# Patient Record
Sex: Female | Born: 2000
Health system: Southern US, Community
[De-identification: ages and names within clinical notes are randomized; demographics above are authoritative.]

## PROBLEM LIST (undated history)

## (undated) DIAGNOSIS — F419 Anxiety disorder, unspecified: Secondary | ICD-10-CM

## (undated) DIAGNOSIS — E611 Iron deficiency: Secondary | ICD-10-CM

## (undated) HISTORY — DX: Iron deficiency: E61.1

## (undated) HISTORY — DX: Anxiety disorder, unspecified: F41.9

---
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
## 2020-01-29 ENCOUNTER — Ambulatory Visit (INDEPENDENT_AMBULATORY_CARE_PROVIDER_SITE_OTHER): Payer: 59 | Admitting: Pediatrics

## 2020-01-29 ENCOUNTER — Encounter: Payer: Self-pay | Admitting: Pediatrics

## 2020-01-29 ENCOUNTER — Other Ambulatory Visit: Payer: Self-pay

## 2020-01-29 ENCOUNTER — Telehealth: Payer: Self-pay | Admitting: Pediatrics

## 2020-01-29 VITALS — BP 141/91 | HR 121 | Ht 65.35 in | Wt 156.2 lb

## 2020-01-29 DIAGNOSIS — D508 Other iron deficiency anemias: Secondary | ICD-10-CM

## 2020-01-29 NOTE — Telephone Encounter (Signed)
I assume she means Ferritin. Offer her an appointment.

## 2020-01-29 NOTE — Progress Notes (Signed)
   Patient was accompanied by mom amy, who is the primary historian.

## 2020-01-29 NOTE — Progress Notes (Signed)
  Subjective:     Patient ID: Anna Mcknight, female   DOB: [birth year], 19 y.o.   MRN: [id number]  Patient went to donate blood and was declined due to a ferritin level of 8. Previous levels were 23 in Nov 2020 and 21 in Oct 2019.  Patient denies excessive menses, reporting a 3-4 day duration of moderate blood loss. She denies any abdominal pain, black tar-like stools or BRB per rectum.   She reports that she is a picky eater and eats very few vegetables. She does eat some red meat.     Review of Systems  Constitutional: Negative for activity change.  Respiratory: Negative for shortness of breath.   Cardiovascular: Negative for palpitations.  Neurological: Negative for dizziness and headaches.  All other systems reviewed and are negative.      Objective:   Physical Exam Constitutional:      Appearance: Normal appearance. In no apparent distress HENT:     Head: Normocephalic and atraumatic.     Right Ear: Tympanic membrane and ear canal normal.     Left Ear: Tympanic membrane and ear canal normal.     Nose: Nose normal.     Mouth/Throat:     Mouth: Mucous membranes are moist.     Pharynx: Oropharynx is clear.  Eyes:     Conjunctiva/sclera: Conjunctivae normal.  Neck:     Musculoskeletal: Neck supple.  Cardiovascular:     Rate and Rhythm: Slightly tachycardic rate and regular rhythm.     Pulses: Normal pulses.     Heart sounds: Normal heart sounds. No murmur.  Pulmonary:     Effort: Pulmonary effort is normal.     Breath sounds: Normal breath sounds.  Abdominal:     General: Abdomen is flat. Bowel sounds are normal. There is no distension.     Palpations: Abdomen is soft.     Tenderness: There is no abdominal tenderness.  Skin:    General: Skin is warm and dry. No rash    Assessment:        Iron deficiency anemia secondary to inadequate dietary iron intake - Plan: CBC w/Diff/Platelet, Fe+TIBC+Fer, CBC w/Diff/Platelet, Ferritin    Plan:      Will obtain labs to  confirm suspicion.  Patient provided with list of iron rich foods. Mom can start OTC iron supplement. Monitor for constipation.  Given la b slip to reck in 3 months.

## 2020-01-29 NOTE — Patient Instructions (Signed)

## 2020-01-29 NOTE — Telephone Encounter (Signed)
Appt made

## 2020-01-29 NOTE — Telephone Encounter (Signed)
Child donated blood on Feb 4 and mom was contacted by [facility] that child's serritin level was an 8. On Oct 10, 2019 serritin level was a 36, an on Sep 24, 2018 was a 21. Mom requesting labs but does child need to come in for an appt and then be referred.

## 2020-01-30 ENCOUNTER — Encounter: Payer: Self-pay | Admitting: Pediatrics

## 2020-01-30 LAB — CBC WITH DIFFERENTIAL/PLATELET
Basophils Absolute: 0 10*3/uL (ref 0.0–0.2)
Basos: 1 %
EOS (ABSOLUTE): 1 10*3/uL — ABNORMAL HIGH (ref 0.0–0.4)
Eos: 14 %
Hematocrit: 34.6 % (ref 34.0–46.6)
Hemoglobin: 11.8 g/dL (ref 11.1–15.9)
Immature Grans (Abs): 0 10*3/uL (ref 0.0–0.1)
Immature Granulocytes: 0 %
Lymphocytes Absolute: 2.4 10*3/uL (ref 0.7–3.1)
Lymphs: 32 %
MCH: 31.4 pg (ref 26.6–33.0)
MCHC: 34.1 g/dL (ref 31.5–35.7)
MCV: 92 fL (ref 79–97)
Monocytes Absolute: 0.4 10*3/uL (ref 0.1–0.9)
Monocytes: 5 %
Neutrophils Absolute: 3.6 10*3/uL (ref 1.4–7.0)
Neutrophils: 48 %
Platelets: 338 10*3/uL (ref 150–450)
RBC: 3.76 x10E6/uL — ABNORMAL LOW (ref 3.77–5.28)
RDW: 11.4 % — ABNORMAL LOW (ref 11.7–15.4)
WBC: 7.4 10*3/uL (ref 3.4–10.8)

## 2020-01-30 LAB — IRON,TIBC AND FERRITIN PANEL
Ferritin: 10 ng/mL — ABNORMAL LOW (ref 15–77)
Iron Saturation: 14 % — ABNORMAL LOW (ref 15–55)
Iron: 54 ug/dL (ref 27–159)
Total Iron Binding Capacity: 394 ug/dL (ref 250–450)
UIBC: 340 ug/dL (ref 131–425)

## 2020-01-30 NOTE — Progress Notes (Signed)
Please inform this patient/ family that her low ferritin level has been confirmed. Her anemia is very mild so the use of a supplement and the addition of iron rich foods should resolve the matter. Follow through with plans to reck in 3 months.

## 2020-02-29 DIAGNOSIS — Z23 Encounter for immunization: Secondary | ICD-10-CM | POA: Diagnosis not present

## 2020-03-23 DIAGNOSIS — Z23 Encounter for immunization: Secondary | ICD-10-CM | POA: Diagnosis not present

## 2020-04-26 ENCOUNTER — Other Ambulatory Visit: Payer: Self-pay | Admitting: Pediatrics

## 2020-04-26 DIAGNOSIS — D508 Other iron deficiency anemias: Secondary | ICD-10-CM | POA: Diagnosis not present

## 2020-04-27 LAB — CBC WITH DIFFERENTIAL/PLATELET
Basophils Absolute: 0 10*3/uL (ref 0.0–0.2)
Basos: 1 %
EOS (ABSOLUTE): 0.5 10*3/uL — ABNORMAL HIGH (ref 0.0–0.4)
Eos: 13 %
Hematocrit: 39.5 % (ref 34.0–46.6)
Hemoglobin: 13 g/dL (ref 11.1–15.9)
Immature Grans (Abs): 0 10*3/uL (ref 0.0–0.1)
Immature Granulocytes: 0 %
Lymphocytes Absolute: 1.8 10*3/uL (ref 0.7–3.1)
Lymphs: 44 %
MCH: 31 pg (ref 26.6–33.0)
MCHC: 32.9 g/dL (ref 31.5–35.7)
MCV: 94 fL (ref 79–97)
Monocytes Absolute: 0.4 10*3/uL (ref 0.1–0.9)
Monocytes: 8 %
Neutrophils Absolute: 1.4 10*3/uL (ref 1.4–7.0)
Neutrophils: 34 %
Platelets: 277 10*3/uL (ref 150–450)
RBC: 4.19 x10E6/uL (ref 3.77–5.28)
RDW: 12.2 % (ref 11.7–15.4)
WBC: 4.2 10*3/uL (ref 3.4–10.8)

## 2020-04-27 LAB — IRON,TIBC AND FERRITIN PANEL
Ferritin: 13 ng/mL — ABNORMAL LOW (ref 15–77)
Iron Saturation: 33 % (ref 15–55)
Iron: 120 ug/dL (ref 27–159)
Total Iron Binding Capacity: 359 ug/dL (ref 250–450)
UIBC: 239 ug/dL (ref 131–425)

## 2020-04-29 NOTE — Progress Notes (Signed)
Please inform this family that the patient's results show an improvement in her anemia. Her red cell indices and iron saturation are now within normal limits.  The only slightly abnormal value is her ferritin level.  This indicates that her stored iron remains slightly below normal.  She should continue her supplements.

## 2020-05-03 ENCOUNTER — Encounter: Payer: Self-pay | Admitting: Pediatrics

## 2020-05-03 ENCOUNTER — Other Ambulatory Visit: Payer: Self-pay

## 2020-05-03 ENCOUNTER — Ambulatory Visit (INDEPENDENT_AMBULATORY_CARE_PROVIDER_SITE_OTHER): Payer: 59 | Admitting: Pediatrics

## 2020-05-03 VITALS — BP 152/82 | HR 108 | Ht 65.55 in | Wt 153.2 lb

## 2020-05-03 DIAGNOSIS — D508 Other iron deficiency anemias: Secondary | ICD-10-CM

## 2020-05-03 NOTE — Progress Notes (Signed)
   Patient was accompanied by self, who is the primary historian.      HPI: The patient presents for evaluation of : Anemia  This patient has a history of iron deficiency. This condition has largely been resolved with vitamin supplementation.Is taking  324 mg of iron Q day.  Has also been able to incorporate iron-rich foods into diet.   PMH: History reviewed. No pertinent past medical history. No current outpatient medications on file.   No current facility-administered medications for this visit.   No Known Allergies     VITALS: BP (!) 152/82   Pulse (!) 108   Ht 5' 5.55" (1.665 m)   Wt 153 lb 3.2 oz (69.5 kg)   SpO2 100%   BMI 25.07 kg/m    PHYSICAL EXAM: GEN:  Alert, active, no acute distress HEENT:  Normocephalic.           Pupils equally round and reactive to light.           Tympanic membranes are pearly gray bilaterally.            Turbinates:  normal          No oropharyngeal lesions.  NECK:  Supple. Full range of motion.  No thyromegaly.  No lymphadenopathy.  CARDIOVASCULAR:  Normal S1, S2.  No gallops or clicks.  No murmurs.   LUNGS:  Normal shape.  Clear to auscultation.   ABDOMEN:  Normoactive  bowel sounds.  No masses.  No hepatosplenomegaly. SKIN:  Warm. Dry. No rash   LABS: No results found for any visits on 05/03/20.   ASSESSMENT/PLAN: Iron deficiency anemia secondary to inadequate dietary iron intake  Recent labs indicate condition is largely corrected. Hgb= 13 and iron saturation is normal. Her ferritin is still slightly decreased. Advised to continue supplement 2-3 times per week  and maintain dietary changes as she will have continued iron loss through menses. Patient expressed understanding.

## 2020-05-16 ENCOUNTER — Ambulatory Visit: Payer: Self-pay | Admitting: Family Medicine

## 2020-05-21 ENCOUNTER — Other Ambulatory Visit: Payer: Self-pay

## 2020-05-22 ENCOUNTER — Ambulatory Visit: Payer: 59 | Admitting: Family Medicine

## 2020-05-22 ENCOUNTER — Encounter: Payer: Self-pay | Admitting: Family Medicine

## 2020-05-22 VITALS — BP 110/80 | HR 102 | Temp 98.4°F | Ht 66.75 in | Wt 153.0 lb

## 2020-05-22 DIAGNOSIS — M419 Scoliosis, unspecified: Secondary | ICD-10-CM

## 2020-05-22 DIAGNOSIS — D508 Other iron deficiency anemias: Secondary | ICD-10-CM | POA: Diagnosis not present

## 2020-05-22 NOTE — Patient Instructions (Signed)
Health Maintenance Due  Topic Date Due  . Hepatitis C Screening  Never done  . HIV Screening  Never done    No flowsheet data found. 

## 2020-05-22 NOTE — Progress Notes (Addendum)
Anna Mcknight DOB: 17-Nov-2001 Encounter date: 05/22/2020  This is a 19 y.o. female who presents to establish care. Chief Complaint  Patient presents with  . Establish Care    History of present illness: Just graduated and going to [employer] in fall; majoring in Advertising account planner.   Saw pediatrician 2 weeks ago and had to move to regular doc.   Was donating blood to [facility] and they sent letter stating her ferritin was low. Started on otc iron - went from 10-13 (ferritin). Taking iron supplement 2-3 times/week.   Periods - last for 5 days. Changing pads/tampons 4-6 x/day. No blood clots.    Past Medical History:  Diagnosis Date  . Iron deficiency    Past Surgical History:  Procedure Laterality Date  . WISDOM TOOTH EXTRACTION  2020   No Known Allergies Current Meds  Medication Sig  . ferrous sulfate 324 MG TBEC Take 324 mg by mouth 2 (two) times a week.   Social History   Tobacco Use  . Smoking status: Never Smoker  . Smokeless tobacco: Never Used  Substance Use Topics  . Alcohol use: Never   Family History  Problem Relation Age of Onset  . Breast cancer Mother 52  . Heart disease Father   . Hypertension Father   . Hyperlipidemia Maternal Grandmother   . Hypertension Maternal Grandmother   . Breast cancer Maternal Grandmother 6  . Prostate cancer Maternal Grandfather   . Diabetes Maternal Grandfather   . Stomach cancer Paternal Grandmother   . Hyperlipidemia Paternal Grandfather   . Hypertension Paternal Grandfather      Review of Systems  Constitutional: Negative for chills, fatigue and fever.  Respiratory: Negative for cough, chest tightness, shortness of breath and wheezing.   Cardiovascular: Negative for chest pain, palpitations and leg swelling.    Objective:  BP 110/80   Pulse (!) 102   Temp 98.4 F (36.9 C) (Other (Comment))   Ht 5' 6.75" (1.695 m)   Wt 153 lb (69.4 kg)   LMP 05/09/2020   SpO2 93%   BMI 24.14 kg/m   Weight: 153 lb (69.4  kg)   BP Readings from Last 3 Encounters:  05/22/20 110/80  05/03/20 (!) 152/82  01/29/20 (!) 141/91   Wt Readings from Last 3 Encounters:  05/22/20 153 lb (69.4 kg) (85 %, Z= 1.04)*  05/03/20 153 lb 3.2 oz (69.5 kg) (85 %, Z= 1.05)*  01/29/20 156 lb 3.2 oz (70.9 kg) (87 %, Z= 1.15)*   * Growth percentiles are based on CDC (Girls, 2-20 Years) data.    Physical Exam Constitutional:      General: She is not in acute distress.    Appearance: She is well-developed.  Cardiovascular:     Rate and Rhythm: Normal rate and regular rhythm.     Heart sounds: Normal heart sounds. No murmur heard.  No friction rub.  Pulmonary:     Effort: Pulmonary effort is normal. No respiratory distress.     Breath sounds: Normal breath sounds. No wheezing or rales.  Musculoskeletal:     Right lower leg: No edema.     Left lower leg: No edema.     Comments: Thoracic spine scoliosis  Neurological:     Mental Status: She is alert and oriented to person, place, and time.  Psychiatric:        Behavior: Behavior normal.     Assessment/Plan:  1. Iron deficiency anemia secondary to inadequate dietary iron intake Continue with iron  supplementation. She is taking just 2 days/week and taking with orange juice so we should see some ferritin improvement with this. We can recheck bloodwork at next visit.   2. Scoliosis, unspecified scoliosis type, unspecified spinal region Does not have any physical pain with this. Stays active, regular exercise.   Return in about 6 months (around 11/21/2020) for physical exam.  Micheline Rough, MD

## 2020-11-25 ENCOUNTER — Other Ambulatory Visit: Payer: Self-pay

## 2020-11-25 ENCOUNTER — Ambulatory Visit (INDEPENDENT_AMBULATORY_CARE_PROVIDER_SITE_OTHER): Payer: 59 | Admitting: Family Medicine

## 2020-11-25 ENCOUNTER — Encounter: Payer: Self-pay | Admitting: Family Medicine

## 2020-11-25 VITALS — BP 104/78 | HR 73 | Temp 98.8°F | Ht 66.25 in | Wt 159.8 lb

## 2020-11-25 DIAGNOSIS — Z131 Encounter for screening for diabetes mellitus: Secondary | ICD-10-CM

## 2020-11-25 DIAGNOSIS — Z0000 Encounter for general adult medical examination without abnormal findings: Secondary | ICD-10-CM

## 2020-11-25 DIAGNOSIS — E611 Iron deficiency: Secondary | ICD-10-CM

## 2020-11-25 DIAGNOSIS — Z13220 Encounter for screening for lipoid disorders: Secondary | ICD-10-CM | POA: Diagnosis not present

## 2020-11-25 DIAGNOSIS — D649 Anemia, unspecified: Secondary | ICD-10-CM | POA: Diagnosis not present

## 2020-11-25 NOTE — Progress Notes (Signed)
Anna Mcknight DOB: 2001-05-31 Encounter date: 11/25/2020  This is a 19 y.o. female who presents for complete physical   History of present illness/Additional concerns: Iron deficiency: does need to take iron with food; still taking twice weekly.   Seeing dentist regularly (in last 2 weeks); has eye exam coming up.  Exercises regularly - bike, walking.   Periods are regular; 5 days. She is not sexually active/has not been in past.   Past Medical History:  Diagnosis Date  . Iron deficiency    Past Surgical History:  Procedure Laterality Date  . WISDOM TOOTH EXTRACTION  2020   No Known Allergies Current Meds  Medication Sig  . ferrous sulfate 324 MG TBEC Take 324 mg by mouth 2 (two) times a week.   Social History   Tobacco Use  . Smoking status: Never Smoker  . Smokeless tobacco: Never Used  Substance Use Topics  . Alcohol use: Never   Family History  Problem Relation Age of Onset  . Breast cancer Mother 49  . Heart disease Father   . Hypertension Father   . Hyperlipidemia Maternal Grandmother   . Hypertension Maternal Grandmother   . Breast cancer Maternal Grandmother 41  . Prostate cancer Maternal Grandfather   . Diabetes Maternal Grandfather   . Stomach cancer Paternal Grandmother   . Hyperlipidemia Paternal Grandfather   . Hypertension Paternal Grandfather      Review of Systems  Constitutional: Negative for activity change, appetite change, chills, fatigue, fever and unexpected weight change.  HENT: Negative for congestion, ear pain, hearing loss, sinus pressure, sinus pain, sore throat and trouble swallowing.   Eyes: Negative for pain and visual disturbance.  Respiratory: Negative for cough, chest tightness, shortness of breath and wheezing.   Cardiovascular: Negative for chest pain, palpitations and leg swelling.  Gastrointestinal: Negative for abdominal pain, blood in stool, constipation, diarrhea, nausea and vomiting.  Genitourinary: Negative for  difficulty urinating and menstrual problem.  Musculoskeletal: Negative for arthralgias and back pain.  Skin: Negative for rash.  Neurological: Negative for dizziness, weakness, numbness and headaches.  Hematological: Negative for adenopathy. Does not bruise/bleed easily.  Psychiatric/Behavioral: Negative for sleep disturbance and suicidal ideas. The patient is not nervous/anxious.     CBC:  Lab Results  Component Value Date   WBC 4.2 04/26/2020   HGB 13.0 04/26/2020   HCT 39.5 04/26/2020   MCH 31.0 04/26/2020   MCHC 32.9 04/26/2020   RDW 12.2 04/26/2020   PLT 277 04/26/2020   CMP:No results found for: NA, K, CL, CO2, ANIONGAP, GLUCOSE, BUN, CREATININE, LABGLOB, GFRAA, CALCIUM, PROT, AGRATIO, BILITOT, ALKPHOS, ALT, AST, GLOB LIPID:No results found for: CHOL, TRIG, HDL, LDLCALC, LABVLDL  Objective:  BP 104/78 (BP Location: Right Arm, Patient Position: Sitting, Cuff Size: Large)   Pulse 73   Temp 98.8 F (37.1 C) (Oral)   Ht 5' 6.25" (1.683 m)   Wt 159 lb 12.8 oz (72.5 kg)   LMP 11/20/2020 (Exact Date)   BMI 25.60 kg/m   Weight: 159 lb 12.8 oz (72.5 kg)   BP Readings from Last 3 Encounters:  11/25/20 104/78  05/22/20 110/80  05/03/20 (!) 152/82   Wt Readings from Last 3 Encounters:  11/25/20 159 lb 12.8 oz (72.5 kg) (88 %, Z= 1.18)*  05/22/20 153 lb (69.4 kg) (85 %, Z= 1.04)*  05/03/20 153 lb 3.2 oz (69.5 kg) (85 %, Z= 1.05)*   * Growth percentiles are based on CDC (Girls, 2-20 Years) data.    Physical  Exam Constitutional:      General: She is not in acute distress.    Appearance: She is well-developed and well-nourished.  HENT:     Head: Normocephalic and atraumatic.     Right Ear: External ear normal.     Left Ear: External ear normal.     Mouth/Throat:     Mouth: Oropharynx is clear and moist.     Pharynx: No oropharyngeal exudate.  Eyes:     Conjunctiva/sclera: Conjunctivae normal.     Pupils: Pupils are equal, round, and reactive to light.  Neck:      Thyroid: No thyromegaly.  Cardiovascular:     Rate and Rhythm: Normal rate and regular rhythm.     Heart sounds: Normal heart sounds. No murmur heard. No friction rub. No gallop.   Pulmonary:     Effort: Pulmonary effort is normal.     Breath sounds: Normal breath sounds.  Chest:  Breasts:     Right: Normal.     Left: Normal.    Abdominal:     General: Bowel sounds are normal. There is no distension.     Palpations: Abdomen is soft. There is no mass.     Tenderness: There is no abdominal tenderness. There is no guarding.     Hernia: No hernia is present.  Musculoskeletal:        General: No tenderness, deformity or edema. Normal range of motion.     Cervical back: Normal range of motion and neck supple.  Lymphadenopathy:     Cervical: No cervical adenopathy.  Skin:    General: Skin is warm and dry.     Findings: No rash.  Neurological:     Mental Status: She is alert and oriented to person, place, and time.     Deep Tendon Reflexes: Strength normal. Reflexes normal.     Reflex Scores:      Tricep reflexes are 2+ on the right side and 2+ on the left side.      Bicep reflexes are 2+ on the right side and 2+ on the left side.      Brachioradialis reflexes are 2+ on the right side and 2+ on the left side.      Patellar reflexes are 2+ on the right side and 2+ on the left side. Psychiatric:        Mood and Affect: Mood and affect normal.        Speech: Speech normal.        Behavior: Behavior normal.        Thought Content: Thought content normal.     Assessment/Plan: There are no preventive care reminders to display for this patient. Health Maintenance reviewed - urinary microalbumin ordered.  1. Preventative health care Keep up with exercise; up to date with preventative health care. Has had flu shot this season.   2. Iron deficiency Taking supplemental 325mg  iron twice weekly; recheck levels today. - Iron, TIBC and Ferritin Panel; Future - CBC with  Differential/Platelet; Future  3. Lipid screening - Lipid panel; Future  4. Screening for diabetes mellitus - Comprehensive metabolic panel; Future  5. Anemia, unspecified type - Vitamin B12; Future   Return in about 1 year (around 11/25/2021) for physical exam.  11/27/2021, MD

## 2020-11-26 LAB — CBC WITH DIFFERENTIAL/PLATELET
Absolute Monocytes: 271 cells/uL (ref 200–950)
Basophils Absolute: 30 cells/uL (ref 0–200)
Basophils Relative: 0.9 %
Eosinophils Absolute: 300 cells/uL (ref 15–500)
Eosinophils Relative: 9.1 %
HCT: 37.8 % (ref 35.0–45.0)
Hemoglobin: 13.1 g/dL (ref 11.7–15.5)
Lymphs Abs: 1449 cells/uL (ref 850–3900)
MCH: 33.5 pg — ABNORMAL HIGH (ref 27.0–33.0)
MCHC: 34.7 g/dL (ref 32.0–36.0)
MCV: 96.7 fL (ref 80.0–100.0)
MPV: 9.8 fL (ref 7.5–12.5)
Monocytes Relative: 8.2 %
Neutro Abs: 1251 cells/uL — ABNORMAL LOW (ref 1500–7800)
Neutrophils Relative %: 37.9 %
Platelets: 282 10*3/uL (ref 140–400)
RBC: 3.91 10*6/uL (ref 3.80–5.10)
RDW: 11.6 % (ref 11.0–15.0)
Total Lymphocyte: 43.9 %
WBC: 3.3 10*3/uL — ABNORMAL LOW (ref 3.8–10.8)

## 2020-11-26 LAB — LIPID PANEL
Cholesterol: 159 mg/dL (ref <170)
HDL: 59 mg/dL (ref >45)
LDL Cholesterol (Calc): 84 mg/dL (calc) (ref <110)
Non-HDL Cholesterol (Calc): 100 mg/dL (calc) (ref <120)
Total CHOL/HDL Ratio: 2.7 (calc) (ref <5.0)
Triglycerides: 69 mg/dL (ref <90)

## 2020-11-26 LAB — IRON,TIBC AND FERRITIN PANEL
%SAT: 30 % (calc) (ref 15–45)
Ferritin: 16 ng/mL (ref 16–154)
Iron: 112 ug/dL (ref 27–164)
TIBC: 368 mcg/dL (calc) (ref 271–448)

## 2020-11-26 LAB — COMPREHENSIVE METABOLIC PANEL
AG Ratio: 1.6 (calc) (ref 1.0–2.5)
ALT: 32 U/L (ref 5–32)
AST: 25 U/L (ref 12–32)
Albumin: 4.7 g/dL (ref 3.6–5.1)
Alkaline phosphatase (APISO): 60 U/L (ref 36–128)
BUN: 8 mg/dL (ref 7–20)
CO2: 25 mmol/L (ref 20–32)
Calcium: 9.5 mg/dL (ref 8.9–10.4)
Chloride: 106 mmol/L (ref 98–110)
Creat: 0.6 mg/dL (ref 0.50–1.00)
Globulin: 2.9 g/dL (calc) (ref 2.0–3.8)
Glucose, Bld: 86 mg/dL (ref 65–99)
Potassium: 4.3 mmol/L (ref 3.8–5.1)
Sodium: 141 mmol/L (ref 135–146)
Total Bilirubin: 0.4 mg/dL (ref 0.2–1.1)
Total Protein: 7.6 g/dL (ref 6.3–8.2)

## 2020-11-26 LAB — VITAMIN B12: Vitamin B-12: 403 pg/mL (ref 200–1100)

## 2020-11-28 ENCOUNTER — Other Ambulatory Visit: Payer: Self-pay

## 2020-11-28 ENCOUNTER — Inpatient Hospital Stay (HOSPITAL_COMMUNITY): Payer: 59 | Attending: Hematology and Oncology | Admitting: Hematology and Oncology

## 2020-11-28 ENCOUNTER — Encounter (HOSPITAL_COMMUNITY): Payer: Self-pay | Admitting: Hematology and Oncology

## 2020-11-28 ENCOUNTER — Encounter: Payer: Self-pay | Admitting: Family Medicine

## 2020-11-28 DIAGNOSIS — D509 Iron deficiency anemia, unspecified: Secondary | ICD-10-CM | POA: Insufficient documentation

## 2020-11-28 DIAGNOSIS — Z803 Family history of malignant neoplasm of breast: Secondary | ICD-10-CM | POA: Diagnosis not present

## 2020-11-28 DIAGNOSIS — D72819 Decreased white blood cell count, unspecified: Secondary | ICD-10-CM | POA: Insufficient documentation

## 2020-11-28 DIAGNOSIS — Z800 Family history of malignant neoplasm of digestive organs: Secondary | ICD-10-CM | POA: Diagnosis not present

## 2020-11-28 NOTE — Addendum Note (Signed)
Addended by: Johnella Moloney on: 11/28/2020 01:19 PM   Modules accepted: Orders

## 2020-11-28 NOTE — Progress Notes (Signed)
[facility] CONSULT NOTE  Patient Care Team: Wynn Banker, MD as PCP - General (Family Medicine)  CHIEF COMPLAINTS/PURPOSE OF CONSULTATION:  IDA.  ASSESSMENT & PLAN:  No problem-specific Assessment & Plan notes found for this encounter.  No orders of the defined types were placed in this encounter.  This is a very pleasant 19 yr old female patient with no significant PMH referred to hematology for evaluation of IDA. She is doing well, denies any complaints except for mildly low energy. NO other concerning ROS PE unremarkable, no lymphadenopathy or hepatosplenomegaly. I have reviewed labs, ferritin of 16, which didn't respond to oral iron supplementation. I gave her option of trying ferrous sulfate 325 mg PO TID vs trying Venofer 200 mg for 5 doses Discussed that IV iron is very well tolerated in all most patients, small risk of severe allergy but otherwise some people can have flu like symptoms and headaches after the infusion She is agreeable to the infusion. Leukopenia, likely secondary to a viral infection or recovery from a previous infection. She is completely asymptomatic. Repeat CBC recommended in 8 weeks  Anna Logan Maddex Garlitz MD   HISTORY OF PRESENTING ILLNESS:   Anna Mcknight 19 y.o. female is here because of IDA.   This is a very pleasant 19 yr old female patient with no other PMH of iron deficiency who has failed oral iron supplementation referred to hematology for evaluation of IDA. She is doing well except for mildly low energy.  [facility] and she was told that she was iron deficient and hence cant donate.  She took ferrous sulfate 65 mg PO BID for almost 6 months and has now been taking it twice or thrice a week. She denies any heavy menstrual cycles. She has no dietary restrictions. No hematochezia or melena.    REVIEW OF SYSTEMS:   Constitutional: Denies fevers, chills or abnormal night sweats Eyes: Denies  blurriness of vision, double vision or watery eyes Ears, nose, mouth, throat, and face: Denies mucositis or sore throat Respiratory: Denies cough, dyspnea or wheezes Cardiovascular: Denies palpitation, chest discomfort or lower extremity swelling Gastrointestinal:  Denies nausea, heartburn or change in bowel habits Skin: Denies abnormal skin rashes Lymphatics: Denies new lymphadenopathy or easy bruising Neurological:Denies numbness, tingling or new weaknesses Behavioral/Psych: Mood is stable, no new changes  All other systems were reviewed with the patient and are negative.  MEDICAL HISTORY:  Past Medical History:  Diagnosis Date  . Iron deficiency     SURGICAL HISTORY: Past Surgical History:  Procedure Laterality Date  . WISDOM TOOTH EXTRACTION  2020    SOCIAL HISTORY: Social History   Socioeconomic History  . Marital status: Single    Spouse name: Not on file  . Number of children: Not on file  . Years of education: Not on file  . Highest education level: Not on file  Occupational History  . Not on file  Tobacco Use  . Smoking status: Never Smoker  . Smokeless tobacco: Never Used  Vaping Use  . Vaping Use: Never used  Substance and Sexual Activity  . Alcohol use: Never  . Drug use: Never  . Sexual activity: Never  Other Topics Concern  . Not on file  Social History Narrative  . Not on file   Social Determinants of Health   Financial Resource Strain: Not on file  Food Insecurity: Not on file  Transportation Needs: No Transportation Needs  . Lack of Transportation (  Medical): No  . Lack of Transportation (Non-Medical): No  Physical Activity: Inactive  . Days of Exercise per Week: 0 days  . Minutes of Exercise per Session: 0 min  Stress: Not on file  Social Connections: Not on file  Intimate Partner Violence: Not At Risk  . Fear of Current or Ex-Partner: No  . Emotionally Abused: No  . Physically Abused: No  . Sexually Abused: No    FAMILY  HISTORY: Family History  Problem Relation Age of Onset  . Breast cancer Mother 25  . Heart disease Father   . Hypertension Father   . Hyperlipidemia Maternal Grandmother   . Hypertension Maternal Grandmother   . Breast cancer Maternal Grandmother 58  . Prostate cancer Maternal Grandfather   . Diabetes Maternal Grandfather   . Stomach cancer Paternal Grandmother   . Hyperlipidemia Paternal Grandfather   . Hypertension Paternal Grandfather     ALLERGIES:  has No Known Allergies.  MEDICATIONS:  Current Outpatient Medications  Medication Sig Dispense Refill  . ferrous sulfate 324 MG TBEC Take 324 mg by mouth 2 (two) times a week.     No current facility-administered medications for this visit.     PHYSICAL EXAMINATION: ECOG PERFORMANCE STATUS: 0 - Asymptomatic  There were no vitals filed for this visit. There were no vitals filed for this visit.  GENERAL:alert, no distress and comfortable SKIN: skin color, texture, turgor are normal, no rashes or significant lesions EYES: normal, conjunctiva are pink and non-injected, sclera clear OROPHARYNX:no exudate, no erythema and lips, buccal mucosa, and tongue normal  NECK: supple, thyroid normal size, non-tender, without nodularity LYMPH:  no palpable lymphadenopathy in the cervical, axillary or inguinal LUNGS: clear to auscultation and percussion with normal breathing effort HEART: regular rate & rhythm and no murmurs and no lower extremity edema ABDOMEN:abdomen soft, non-tender and normal bowel sounds Musculoskeletal:no cyanosis of digits and no clubbing  PSYCH: alert & oriented x 3 with fluent speech NEURO: no focal motor/sensory deficits  LABORATORY DATA:  I have reviewed the data as listed Lab Results  Component Value Date   WBC 3.3 (L) 11/25/2020   HGB 13.1 11/25/2020   HCT 37.8 11/25/2020   MCV 96.7 11/25/2020   PLT 282 11/25/2020     Chemistry      Component Value Date/Time   NA 141 11/25/2020 1005   K 4.3  11/25/2020 1005   CL 106 11/25/2020 1005   CO2 25 11/25/2020 1005   BUN 8 11/25/2020 1005   CREATININE 0.60 11/25/2020 1005      Component Value Date/Time   CALCIUM 9.5 11/25/2020 1005   AST 25 11/25/2020 1005   ALT 32 11/25/2020 1005   BILITOT 0.4 11/25/2020 1005       RADIOGRAPHIC STUDIES: I have personally reviewed the radiological images as listed and agreed with the findings in the report. No results found.  All questions were answered. The patient knows to call the clinic with any problems, questions or concerns. I spent 30 minutes in the care of the patient, including H and P, review of records, coordination of care.     Anna Moulds, MD 11/28/2020 3:30 PM

## 2020-11-28 NOTE — Patient Instructions (Signed)
[facility] at [facility] Discharge Instructions  You were seen today by Dr. Iruku. Follow up as scheduled.   Thank you for choosing [facility] at [facility] to provide your oncology and hematology care.  To afford each patient quality time with our provider, please arrive at least 15 minutes before your scheduled appointment time.   If you have a lab appointment with the Cancer Center please come in thru the Main Entrance and check in at the main information desk.  You need to re-schedule your appointment should you arrive 10 or more minutes late.  We strive to give you quality time with our providers, and arriving late affects you and other patients whose appointments are after yours.  Also, if you no show three or more times for appointments you may be dismissed from the clinic at the providers discretion.     Again, thank you for choosing [facility].  Our hope is that these requests will decrease the amount of time that you wait before being seen by our physicians.       _____________________________________________________________  Should you have questions after your visit to [facility], please contact our office at [phone] and follow the prompts.  Our office hours are 8:00 a.m. and 4:30 p.m. Monday - Friday.  Please note that voicemails left after 4:00 p.m. may not be returned until the following business day.  We are closed weekends and major holidays.  You do have access to a nurse 24-7, just call the main number to the clinic [phone] and do not press any options, hold on the line and a nurse will answer the phone.    For prescription refill requests, have your pharmacy contact our office and allow 72 hours.    Due to Covid, you will need to wear a mask upon entering the hospital. If you do not have a mask, a mask will be given to you at the Main Entrance upon arrival. For doctor visits, patients may  have 1 support person age 18 or older with them. For treatment visits, patients can not have anyone with them due to social distancing guidelines and our immunocompromised population.     

## 2020-12-02 ENCOUNTER — Encounter (HOSPITAL_COMMUNITY): Payer: Self-pay

## 2020-12-02 ENCOUNTER — Ambulatory Visit (HOSPITAL_COMMUNITY): Payer: 59 | Admitting: Hematology

## 2020-12-02 ENCOUNTER — Inpatient Hospital Stay (HOSPITAL_COMMUNITY): Payer: 59

## 2020-12-02 VITALS — BP 126/80 | HR 86 | Temp 96.8°F | Resp 18

## 2020-12-02 DIAGNOSIS — D508 Other iron deficiency anemias: Secondary | ICD-10-CM

## 2020-12-02 DIAGNOSIS — Z803 Family history of malignant neoplasm of breast: Secondary | ICD-10-CM | POA: Diagnosis not present

## 2020-12-02 DIAGNOSIS — D509 Iron deficiency anemia, unspecified: Secondary | ICD-10-CM | POA: Diagnosis not present

## 2020-12-02 DIAGNOSIS — Z800 Family history of malignant neoplasm of digestive organs: Secondary | ICD-10-CM | POA: Diagnosis not present

## 2020-12-02 DIAGNOSIS — D72819 Decreased white blood cell count, unspecified: Secondary | ICD-10-CM | POA: Diagnosis not present

## 2020-12-02 MED ORDER — IRON SUCROSE 20 MG/ML IV SOLN
200.0000 mg | Freq: Once | INTRAVENOUS | Status: AC
  Administered 2020-12-02: 200 mg via INTRAVENOUS
  Filled 2020-12-02: qty 10

## 2020-12-02 MED ORDER — SODIUM CHLORIDE 0.9 % IV SOLN: Freq: Once | INTRAVENOUS | Status: AC

## 2020-12-02 NOTE — Patient Instructions (Signed)
[facility] at [facility]  Discharge Instructions:   _______________________________________________________________  Thank you for choosing [facility] at [facility] to provide your oncology and hematology care.  To afford each patient quality time with our providers, please arrive at least 15 minutes before your scheduled appointment.  You need to re-schedule your appointment if you arrive 10 or more minutes late.  We strive to give you quality time with our providers, and arriving late affects you and other patients whose appointments are after yours.  Also, if you no show three or more times for appointments you may be dismissed from the clinic.  Again, thank you for choosing [facility] at [facility]. Our hope is that these requests will allow you access to exceptional care and in a timely manner. _______________________________________________________________  If you have questions after your visit, please contact our office at [phone] between the hours of 8:30 a.m. and 5:00 p.m. Voicemails left after 4:30 p.m. will not be returned until the following business day. _______________________________________________________________  For prescription refill requests, have your pharmacy contact our office. _______________________________________________________________  Recommendations made by the consultant and any test results will be sent to your referring physician. _______________________________________________________________ 

## 2020-12-02 NOTE — Telephone Encounter (Signed)
This was addressed through results note.

## 2020-12-02 NOTE — Progress Notes (Signed)
Tolerated iron infusion without incidence today.  Discharged ambulatory in stable condition. Vital signs stable prior to discharge.

## 2020-12-04 ENCOUNTER — Encounter (HOSPITAL_COMMUNITY): Payer: Self-pay

## 2020-12-04 ENCOUNTER — Inpatient Hospital Stay (HOSPITAL_COMMUNITY): Payer: 59

## 2020-12-04 ENCOUNTER — Other Ambulatory Visit: Payer: Self-pay

## 2020-12-04 VITALS — BP 114/68 | HR 86 | Temp 97.0°F | Resp 18

## 2020-12-04 DIAGNOSIS — Z803 Family history of malignant neoplasm of breast: Secondary | ICD-10-CM | POA: Diagnosis not present

## 2020-12-04 DIAGNOSIS — D508 Other iron deficiency anemias: Secondary | ICD-10-CM

## 2020-12-04 DIAGNOSIS — Z800 Family history of malignant neoplasm of digestive organs: Secondary | ICD-10-CM | POA: Diagnosis not present

## 2020-12-04 DIAGNOSIS — D72819 Decreased white blood cell count, unspecified: Secondary | ICD-10-CM | POA: Diagnosis not present

## 2020-12-04 DIAGNOSIS — D509 Iron deficiency anemia, unspecified: Secondary | ICD-10-CM | POA: Diagnosis not present

## 2020-12-04 MED ORDER — SODIUM CHLORIDE 0.9 % IV SOLN
200.0000 mg | Freq: Once | INTRAVENOUS | Status: AC
  Administered 2020-12-04: 08:00:00 200 mg via INTRAVENOUS
  Filled 2020-12-04: qty 200

## 2020-12-04 MED ORDER — SODIUM CHLORIDE 0.9 % IV SOLN: Freq: Once | INTRAVENOUS | Status: AC

## 2020-12-04 NOTE — Progress Notes (Signed)
Patient tolerated iron infusion with no complaints voiced.  Peripheral IV site clean and dry with good blood return noted before and after infusion.  Band aid applied.  VSS with discharge and left in satisfactory condition with no s/s of distress noted.   

## 2020-12-05 ENCOUNTER — Ambulatory Visit (HOSPITAL_COMMUNITY): Payer: 59

## 2020-12-09 ENCOUNTER — Encounter (HOSPITAL_COMMUNITY): Payer: Self-pay

## 2020-12-09 ENCOUNTER — Other Ambulatory Visit: Payer: Self-pay

## 2020-12-09 ENCOUNTER — Inpatient Hospital Stay (HOSPITAL_COMMUNITY): Payer: 59 | Attending: Hematology

## 2020-12-09 VITALS — BP 118/71 | HR 87 | Temp 96.8°F | Resp 18

## 2020-12-09 DIAGNOSIS — H5213 Myopia, bilateral: Secondary | ICD-10-CM | POA: Diagnosis not present

## 2020-12-09 DIAGNOSIS — D509 Iron deficiency anemia, unspecified: Secondary | ICD-10-CM | POA: Diagnosis not present

## 2020-12-09 DIAGNOSIS — D508 Other iron deficiency anemias: Secondary | ICD-10-CM

## 2020-12-09 MED ORDER — SODIUM CHLORIDE 0.9 % IV SOLN
200.0000 mg | Freq: Once | INTRAVENOUS | Status: AC
  Administered 2020-12-09: 200 mg via INTRAVENOUS
  Filled 2020-12-09: qty 200

## 2020-12-09 MED ORDER — SODIUM CHLORIDE 0.9 % IV SOLN: Freq: Once | INTRAVENOUS | Status: AC

## 2020-12-09 NOTE — Patient Instructions (Signed)
[facility] at [facility]  Discharge Instructions:   _______________________________________________________________  Thank you for choosing [facility] at [facility] to provide your oncology and hematology care.  To afford each patient quality time with our providers, please arrive at least 15 minutes before your scheduled appointment.  You need to re-schedule your appointment if you arrive 10 or more minutes late.  We strive to give you quality time with our providers, and arriving late affects you and other patients whose appointments are after yours.  Also, if you no show three or more times for appointments you may be dismissed from the clinic.  Again, thank you for choosing [facility] at [facility]. Our hope is that these requests will allow you access to exceptional care and in a timely manner. _______________________________________________________________  If you have questions after your visit, please contact our office at [phone] between the hours of 8:30 a.m. and 5:00 p.m. Voicemails left after 4:30 p.m. will not be returned until the following business day. _______________________________________________________________  For prescription refill requests, have your pharmacy contact our office. _______________________________________________________________  Recommendations made by the consultant and any test results will be sent to your referring physician. _______________________________________________________________ 

## 2020-12-09 NOTE — Progress Notes (Signed)
Tolerated treatment well today without incidence.  Vital sings stable prior to discharge.  Discharged ambulatory in stable condition.

## 2020-12-11 ENCOUNTER — Encounter (HOSPITAL_COMMUNITY): Payer: Self-pay

## 2020-12-11 ENCOUNTER — Inpatient Hospital Stay (HOSPITAL_COMMUNITY): Payer: 59

## 2020-12-11 VITALS — BP 113/75 | HR 93 | Temp 97.7°F | Resp 18

## 2020-12-11 DIAGNOSIS — D509 Iron deficiency anemia, unspecified: Secondary | ICD-10-CM | POA: Diagnosis not present

## 2020-12-11 DIAGNOSIS — D508 Other iron deficiency anemias: Secondary | ICD-10-CM

## 2020-12-11 MED ORDER — SODIUM CHLORIDE 0.9 % IV SOLN
200.0000 mg | Freq: Once | INTRAVENOUS | Status: AC
  Administered 2020-12-11: 200 mg via INTRAVENOUS
  Filled 2020-12-11: qty 200

## 2020-12-11 MED ORDER — SODIUM CHLORIDE 0.9 % IV SOLN: Freq: Once | INTRAVENOUS | Status: AC

## 2020-12-11 NOTE — Progress Notes (Signed)
Tolerated treatment well today without incidence.  Vital signs stable prior to discharge.  Discharged ambulatory in stable condition.  

## 2020-12-11 NOTE — Patient Instructions (Signed)
[facility] Discharge Instructions for Patients Receiving Chemotherapy  Today you received the following chemotherapy agents   To help prevent nausea and vomiting after your treatment, we encourage you to take your nausea medication   If you develop nausea and vomiting that is not controlled by your nausea medication, call the clinic.   BELOW ARE SYMPTOMS THAT SHOULD BE REPORTED IMMEDIATELY:  *FEVER GREATER THAN 100.5 F  *CHILLS WITH OR WITHOUT FEVER  NAUSEA AND VOMITING THAT IS NOT CONTROLLED WITH YOUR NAUSEA MEDICATION  *UNUSUAL SHORTNESS OF BREATH  *UNUSUAL BRUISING OR BLEEDING  TENDERNESS IN MOUTH AND THROAT WITH OR WITHOUT PRESENCE OF ULCERS  *URINARY PROBLEMS  *BOWEL PROBLEMS  UNUSUAL RASH Items with * indicate a potential emergency and should be followed up as soon as possible.  Feel free to call the clinic should you have any questions or concerns. The clinic phone number is [phone].  Please show the CHEMO ALERT CARD at check-in to the Emergency Department and triage nurse.   

## 2020-12-13 ENCOUNTER — Inpatient Hospital Stay (HOSPITAL_COMMUNITY): Payer: 59

## 2020-12-13 ENCOUNTER — Encounter (HOSPITAL_COMMUNITY): Payer: Self-pay

## 2020-12-13 ENCOUNTER — Other Ambulatory Visit: Payer: Self-pay

## 2020-12-13 ENCOUNTER — Other Ambulatory Visit (HOSPITAL_COMMUNITY): Payer: Self-pay

## 2020-12-13 VITALS — BP 115/68 | HR 87 | Temp 97.2°F | Resp 18

## 2020-12-13 DIAGNOSIS — D72819 Decreased white blood cell count, unspecified: Secondary | ICD-10-CM

## 2020-12-13 DIAGNOSIS — D509 Iron deficiency anemia, unspecified: Secondary | ICD-10-CM | POA: Diagnosis not present

## 2020-12-13 DIAGNOSIS — D508 Other iron deficiency anemias: Secondary | ICD-10-CM

## 2020-12-13 MED ORDER — SODIUM CHLORIDE 0.9 % IV SOLN: Freq: Once | INTRAVENOUS | Status: AC

## 2020-12-13 MED ORDER — IRON SUCROSE 20 MG/ML IV SOLN
200.0000 mg | Freq: Once | INTRAVENOUS | Status: AC
  Administered 2020-12-13: 200 mg via INTRAVENOUS
  Filled 2020-12-13: qty 200

## 2020-12-13 NOTE — Progress Notes (Signed)
Patient tolerated iron infusion with no complaints voiced.  Peripheral IV site clean and dry with good blood return noted before and after infusion.  Band aid applied.  VSS with discharge and left in satisfactory condition with no s/s of distress noted.   

## 2021-02-07 DIAGNOSIS — D72819 Decreased white blood cell count, unspecified: Secondary | ICD-10-CM | POA: Diagnosis not present

## 2021-02-07 DIAGNOSIS — D508 Other iron deficiency anemias: Secondary | ICD-10-CM | POA: Diagnosis not present

## 2021-04-24 ENCOUNTER — Other Ambulatory Visit (HOSPITAL_COMMUNITY): Payer: Self-pay

## 2021-04-24 DIAGNOSIS — D72819 Decreased white blood cell count, unspecified: Secondary | ICD-10-CM

## 2021-04-24 DIAGNOSIS — D508 Other iron deficiency anemias: Secondary | ICD-10-CM

## 2021-04-24 NOTE — Progress Notes (Signed)
Orders placed for CBCd, Iron/TIBC, Ferritin per Dr. Ellin Saba. Patient requests this be drawn at [facility]

## 2021-05-02 DIAGNOSIS — D72819 Decreased white blood cell count, unspecified: Secondary | ICD-10-CM | POA: Diagnosis not present

## 2021-05-02 DIAGNOSIS — D508 Other iron deficiency anemias: Secondary | ICD-10-CM | POA: Diagnosis not present

## 2021-05-06 NOTE — Progress Notes (Signed)
[facility] [address], [place] [address]   CLINIC:  Medical Oncology/Hematology  PCP:  Wynn Banker, MD [address] [place] / [place] [address]  [phone]  REASON FOR VISIT:  Follow-up for IDA  PRIOR THERAPY: Intermittent Venofer: last on 12/13/2020  CURRENT THERAPY: under work-up  INTERVAL HISTORY:  Anna Mcknight, a 20 y.o. female, returns for routine follow-up for her IDA. Anna Mcknight was last seen on 11/28/2020.  Today she reports feeling good. She has taken iron tablets for 1 year previously; sh tolerated them well, but she reports they did not help. She reports she never felt any drastic changes in fatigue prior to or following her iron infusions. She eats ice, but it is a baseline craving, not more or less than usual. She reports her hemoglobin has never been significantly low.   REVIEW OF SYSTEMS:  Review of Systems  Constitutional: Negative for appetite change and fatigue.  All other systems reviewed and are negative.   PAST MEDICAL/SURGICAL HISTORY:  Past Medical History:  Diagnosis Date  . Iron deficiency    Past Surgical History:  Procedure Laterality Date  . WISDOM TOOTH EXTRACTION  2020    SOCIAL HISTORY:  Social History   Socioeconomic History  . Marital status: Single    Spouse name: Not on file  . Number of children: Not on file  . Years of education: Not on file  . Highest education level: Not on file  Occupational History  . Not on file  Tobacco Use  . Smoking status: Never Smoker  . Smokeless tobacco: Never Used  Vaping Use  . Vaping Use: Never used  Substance and Sexual Activity  . Alcohol use: Never  . Drug use: Never  . Sexual activity: Never  Other Topics Concern  . Not on file  Social History Narrative  . Not on file   Social Determinants of Health   Financial Resource Strain: Not on file  Food Insecurity: Not on file  Transportation Needs: No Transportation Needs  . Lack of  Transportation (Medical): No  . Lack of Transportation (Non-Medical): No  Physical Activity: Inactive  . Days of Exercise per Week: 0 days  . Minutes of Exercise per Session: 0 min  Stress: Not on file  Social Connections: Not on file  Intimate Partner Violence: Not At Risk  . Fear of Current or Ex-Partner: No  . Emotionally Abused: No  . Physically Abused: No  . Sexually Abused: No    FAMILY HISTORY:  Family History  Problem Relation Age of Onset  . Breast cancer Mother 2  . Heart disease Father   . Hypertension Father   . Hyperlipidemia Maternal Grandmother   . Hypertension Maternal Grandmother   . Breast cancer Maternal Grandmother 82  . Prostate cancer Maternal Grandfather   . Diabetes Maternal Grandfather   . Stomach cancer Paternal Grandmother   . Hyperlipidemia Paternal Grandfather   . Hypertension Paternal Grandfather     CURRENT MEDICATIONS:  Current Outpatient Medications  Medication Sig Dispense Refill  . ferrous sulfate 324 MG TBEC Take 324 mg by mouth 2 (two) times a week.     No current facility-administered medications for this visit.    ALLERGIES:  No Known Allergies  PHYSICAL EXAM:  Performance status (ECOG): 0 - Asymptomatic  There were no vitals filed for this visit. Wt Readings from Last 3 Encounters:  11/25/20 159 lb 12.8 oz (72.5 kg) (88 %, Z= 1.18)*  05/22/20  153 lb (69.4 kg) (85 %, Z= 1.04)*  05/03/20 153 lb 3.2 oz (69.5 kg) (85 %, Z= 1.05)*   * Growth percentiles are based on CDC (Girls, 2-20 Years) data.   Physical Exam Vitals reviewed.  Constitutional:      Appearance: Normal appearance.  Neurological:     General: No focal deficit present.     Mental Status: She is alert and oriented to person, place, and time.  Psychiatric:        Mood and Affect: Mood normal.        Behavior: Behavior normal.     LABORATORY DATA:  I have reviewed the labs as listed.  CBC Latest Ref Rng & Units 11/25/2020 04/26/2020 01/29/2020  WBC 3.8 -  10.8 Thousand/uL 3.3(L) 4.2 7.4  Hemoglobin 11.7 - 15.5 g/dL 90.2 40.9 73.5  Hematocrit 35.0 - 45.0 % 37.8 39.5 34.6  Platelets 140 - 400 Thousand/uL 282 277 338   CMP Latest Ref Rng & Units 11/25/2020  Glucose 65 - 99 mg/dL 86  BUN 7 - 20 mg/dL 8  Creatinine 3.29 - 9.24 mg/dL 2.68  Sodium 341 - 962 mmol/L 141  Potassium 3.8 - 5.1 mmol/L 4.3  Chloride 98 - 110 mmol/L 106  CO2 20 - 32 mmol/L 25  Calcium 8.9 - 10.4 mg/dL 9.5  Total Protein 6.3 - 8.2 g/dL 7.6  Total Bilirubin 0.2 - 1.1 mg/dL 0.4  AST 12 - 32 U/L 25  ALT 5 - 32 U/L 32      Component Value Date/Time   RBC 3.91 11/25/2020 1005   MCV 96.7 11/25/2020 1005   MCV 94 04/26/2020 0915   MCH 33.5 (H) 11/25/2020 1005   MCHC 34.7 11/25/2020 1005   RDW 11.6 11/25/2020 1005   RDW 12.2 04/26/2020 0915   LYMPHSABS 1,449 11/25/2020 1005   LYMPHSABS 1.8 04/26/2020 0915   EOSABS 300 11/25/2020 1005   EOSABS 0.5 (H) 04/26/2020 0915   BASOSABS 30 11/25/2020 1005   BASOSABS 0.0 04/26/2020 0915    DIAGNOSTIC IMAGING:  I have independently reviewed the scans and discussed with the patient. No results found.   ASSESSMENT:  1.  Iron deficiency anemia: - 5 infusions of Venofer from 12/02/2020 through 12/13/2020.  Prior to the infusions, she took ferrous sulfate 65 mg twice daily for almost 1 year. - She denied any heavy menstrual cycles.   PLAN:  1.  Iron deficiency anemia: - We reviewed her labs done in [facility] from March and May of this year. - Her ferritin showed improvement with improvement in her hemoglobin. - She was recommended to start taking iron tablet 325 mg twice daily. - We will check her labs in 3 months with CBC, ferritin and iron panel.  RTC 6 months for follow-up.  Orders placed this encounter:  No orders of the defined types were placed in this encounter.    Doreatha Massed, MD [facility] [phone]   I, Alda Ponder, am acting as a scribe for Dr. Doreatha Massed.  I, Doreatha Massed MD, have reviewed the above documentation for accuracy and completeness, and I agree with the above.

## 2021-05-07 ENCOUNTER — Inpatient Hospital Stay (HOSPITAL_COMMUNITY): Payer: 59 | Attending: Hematology | Admitting: Hematology

## 2021-05-07 ENCOUNTER — Other Ambulatory Visit: Payer: Self-pay

## 2021-05-07 VITALS — BP 128/84 | HR 92 | Temp 97.0°F | Resp 18 | Wt 159.3 lb

## 2021-05-07 DIAGNOSIS — D509 Iron deficiency anemia, unspecified: Secondary | ICD-10-CM | POA: Diagnosis not present

## 2021-05-07 DIAGNOSIS — D508 Other iron deficiency anemias: Secondary | ICD-10-CM

## 2021-05-07 DIAGNOSIS — D72819 Decreased white blood cell count, unspecified: Secondary | ICD-10-CM | POA: Diagnosis not present

## 2021-05-07 NOTE — Patient Instructions (Signed)
[facility] at [facility] Discharge Instructions  You were seen today by Dr. Katragadda. He went over your recent results. Dr. Katragadda will see you back in 6 months for labs and follow up.   Thank you for choosing [facility] at [facility] to provide your oncology and hematology care.  To afford each patient quality time with our provider, please arrive at least 15 minutes before your scheduled appointment time.   If you have a lab appointment with the Cancer Center please come in thru the Main Entrance and check in at the main information desk  You need to re-schedule your appointment should you arrive 10 or more minutes late.  We strive to give you quality time with our providers, and arriving late affects you and other patients whose appointments are after yours.  Also, if you no show three or more times for appointments you may be dismissed from the clinic at the providers discretion.     Again, thank you for choosing [facility].  Our hope is that these requests will decrease the amount of time that you wait before being seen by our physicians.       _____________________________________________________________  Should you have questions after your visit to [facility], please contact our office at [phone] between the hours of 8:00 a.m. and 4:30 p.m.  Voicemails left after 4:00 p.m. will not be returned until the following business day.  For prescription refill requests, have your pharmacy contact our office and allow 72 hours.    Cancer Center Support Programs:   > Cancer Support Group  2nd Tuesday of the month 1pm-2pm, Journey Room    

## 2021-05-08 ENCOUNTER — Encounter (HOSPITAL_COMMUNITY): Payer: Self-pay | Admitting: Hematology and Oncology

## 2021-05-08 NOTE — Addendum Note (Signed)
Addended by: Peggye Pitt on: 05/08/2021 08:08 AM   Modules accepted: Kipp Brood

## 2021-07-14 ENCOUNTER — Other Ambulatory Visit (HOSPITAL_COMMUNITY): Payer: Self-pay

## 2021-07-14 DIAGNOSIS — D508 Other iron deficiency anemias: Secondary | ICD-10-CM

## 2021-07-14 DIAGNOSIS — D72819 Decreased white blood cell count, unspecified: Secondary | ICD-10-CM

## 2021-09-22 ENCOUNTER — Encounter: Payer: Self-pay | Admitting: Hematology

## 2021-09-22 DIAGNOSIS — D508 Other iron deficiency anemias: Secondary | ICD-10-CM | POA: Diagnosis not present

## 2021-09-22 DIAGNOSIS — D72819 Decreased white blood cell count, unspecified: Secondary | ICD-10-CM | POA: Diagnosis not present

## 2021-11-03 ENCOUNTER — Other Ambulatory Visit (HOSPITAL_COMMUNITY): Payer: Self-pay

## 2021-11-03 DIAGNOSIS — D72819 Decreased white blood cell count, unspecified: Secondary | ICD-10-CM

## 2021-11-03 DIAGNOSIS — D508 Other iron deficiency anemias: Secondary | ICD-10-CM

## 2021-11-14 DIAGNOSIS — D72819 Decreased white blood cell count, unspecified: Secondary | ICD-10-CM | POA: Diagnosis not present

## 2021-11-14 DIAGNOSIS — D508 Other iron deficiency anemias: Secondary | ICD-10-CM | POA: Diagnosis not present

## 2021-11-19 NOTE — Progress Notes (Signed)
[place], [place] [address]   CLINIC:  Medical Oncology/Hematology  PCP:  Caren Macadam, MD [address] [place] / [place] [address]  [phone]  REASON FOR VISIT:  Follow-up for IDA  PRIOR THERAPY: Intermittent Venofer: last on 12/13/2020  CURRENT THERAPY: 1/2 iron tablet BID  INTERVAL HISTORY:  [name], a 20 y.o. female, returns for routine follow-up for her IDA. Anna Mcknight was last seen on 05/07/2021.  Today she reports feeling good. She reports occasional numbness in her fingers and toes accompanied by her fingertips turning pale/white for periods longer than 15 minutes, and occurring about once a week; she has noticed this occurring mostly frequently when exposed to cold, and this started after her last visit. When she gets warmer, the paleness and numbness resolves. This numbness and paling of her fingertips and toes has never previously occurred, and she denies any associated pain. She denies joint pains and skin rashes. She take a half tablet of iron BID.   REVIEW OF SYSTEMS:  Review of Systems  Constitutional:  Negative for appetite change and fatigue.  Musculoskeletal:  Negative for arthralgias.  Skin:  Negative for rash.  Neurological:  Positive for numbness (fingers and toes).  All other systems reviewed and are negative.  PAST MEDICAL/SURGICAL HISTORY:  Past Medical History:  Diagnosis Date   Iron deficiency    Past Surgical History:  Procedure Laterality Date   WISDOM TOOTH EXTRACTION  2020    SOCIAL HISTORY:  Social History   Socioeconomic History   Marital status: Single    Spouse name: Not on file   Number of children: Not on file   Years of education: Not on file   Highest education level: Not on file  Occupational History   Not on file  Tobacco Use   Smoking status: Never   Smokeless tobacco: Never  Vaping Use   Vaping Use: Never used  Substance and Sexual Activity   Alcohol  use: Never   Drug use: Never   Sexual activity: Never  Other Topics Concern   Not on file  Social History Narrative   Not on file   Social Determinants of Health   Financial Resource Strain: Not on file  Food Insecurity: Not on file  Transportation Needs: No Transportation Needs   Lack of Transportation (Medical): No   Lack of Transportation (Non-Medical): No  Physical Activity: Inactive   Days of Exercise per Week: 0 days   Minutes of Exercise per Session: 0 min  Stress: Not on file  Social Connections: Not on file  Intimate Partner Violence: Not At Risk   Fear of Current or Ex-Partner: No   Emotionally Abused: No   Physically Abused: No   Sexually Abused: No    FAMILY HISTORY:  Family History  Problem Relation Age of Onset   Breast cancer Mother 28   Heart disease Father    Hypertension Father    Hyperlipidemia Maternal Grandmother    Hypertension Maternal Grandmother    Breast cancer Maternal Grandmother 83   Prostate cancer Maternal Grandfather    Diabetes Maternal Grandfather    Stomach cancer Paternal Grandmother    Hyperlipidemia Paternal Grandfather    Hypertension Paternal Grandfather     CURRENT MEDICATIONS:  Current Outpatient Medications  Medication Sig Dispense Refill   ferrous sulfate 324 MG TBEC Take 324 mg by mouth 2 (two) times a week.     No current facility-administered medications for this  visit.    ALLERGIES:  No Known Allergies  PHYSICAL EXAM:  Performance status (ECOG): 0 - Asymptomatic  Vitals:   11/20/21 0808  BP: 132/87  Pulse: 82  Resp: 18  Temp: 98.8 F (37.1 C)  SpO2: 100%   Wt Readings from Last 3 Encounters:  11/20/21 157 lb (71.2 kg)  05/07/21 159 lb 4.8 oz (72.3 kg) (87 %, Z= 1.13)*  11/25/20 159 lb 12.8 oz (72.5 kg) (88 %, Z= 1.18)*   * Growth percentiles are based on CDC (Girls, 2-20 Years) data.   Physical Exam Vitals reviewed.  Constitutional:      Appearance: Normal appearance.  Cardiovascular:      Rate and Rhythm: Normal rate and regular rhythm.     Pulses: Normal pulses.     Heart sounds: Normal heart sounds.  Pulmonary:     Effort: Pulmonary effort is normal.     Breath sounds: Normal breath sounds.  Abdominal:     Palpations: Abdomen is soft. There is no hepatomegaly, splenomegaly or mass.     Tenderness: There is no abdominal tenderness.  Lymphadenopathy:     Upper Body:     Right upper body: No supraclavicular, axillary or pectoral adenopathy.     Left upper body: No supraclavicular, axillary or pectoral adenopathy.  Neurological:     General: No focal deficit present.     Mental Status: She is alert and oriented to person, place, and time.  Psychiatric:        Mood and Affect: Mood normal.        Behavior: Behavior normal.    LABORATORY DATA:  I have reviewed the labs as listed.  CBC Latest Ref Rng & Units 11/25/2020 04/26/2020 01/29/2020  WBC 3.8 - 10.8 Thousand/uL 3.3(L) 4.2 7.4  Hemoglobin 11.7 - 15.5 g/dL 13.1 13.0 11.8  Hematocrit 35.0 - 45.0 % 37.8 39.5 34.6  Platelets 140 - 400 Thousand/uL 282 277 338   CMP Latest Ref Rng & Units 11/25/2020  Glucose 65 - 99 mg/dL 86  BUN 7 - 20 mg/dL 8  Creatinine 0.50 - 1.00 mg/dL 0.60  Sodium 135 - 146 mmol/L 141  Potassium 3.8 - 5.1 mmol/L 4.3  Chloride 98 - 110 mmol/L 106  CO2 20 - 32 mmol/L 25  Calcium 8.9 - 10.4 mg/dL 9.5  Total Protein 6.3 - 8.2 g/dL 7.6  Total Bilirubin 0.2 - 1.1 mg/dL 0.4  AST 12 - 32 U/L 25  ALT 5 - 32 U/L 32      Component Value Date/Time   RBC 3.91 11/25/2020 1005   MCV 96.7 11/25/2020 1005   MCV 94 04/26/2020 0915   MCH 33.5 (H) 11/25/2020 1005   MCHC 34.7 11/25/2020 1005   RDW 11.6 11/25/2020 1005   RDW 12.2 04/26/2020 0915   LYMPHSABS 1,449 11/25/2020 1005   LYMPHSABS 1.8 04/26/2020 0915   EOSABS 300 11/25/2020 1005   EOSABS 0.5 (H) 04/26/2020 0915   BASOSABS 30 11/25/2020 1005   BASOSABS 0.0 04/26/2020 0915    DIAGNOSTIC IMAGING:  I have independently reviewed the scans  and discussed with the patient. No results found.   ASSESSMENT:  1.  Iron deficiency anemia: - 5 infusions of Venofer from 12/02/2020 through 12/13/2020.  Prior to the infusions, she took ferrous sulfate 65 mg twice daily for almost 1 year. - She denied any heavy menstrual cycles.   PLAN:  1.  Iron deficiency anemia: - She is taking iron half tablet twice daily and is tolerating very  well. - Reviewed [facility] labs from 11/14/2021.  Hemoglobin 13.4 with MCV 94.  Ferritin was 142 with percent saturation 30. - No indication for parenteral iron therapy at this time. - Continue current iron regimen.  Recheck labs in 6 months and RTC in 1 year.  2.  ? Raynaud's phenomenon: - She reports pallor of the fingers of the hand, associated with some numbness, when exposed to cold and at room temperature also.  Finger turns pale and goes numb for about 10 to 15 minutes.  She denies any joint pains or skin rash.  No personal or family history of connective tissue disorders other than family history of autoimmune thyroiditis.  She had it for the past few months. - She does not report blue discoloration of the fingers.  She did show me a picture of her finger which is white when she had the episode.  She had clearly demarcated area of pallor in the tip of the finger.  She has these episodes in  different fingers each time. - I think it is prudent to check for secondary causes of Raynaud's phenomena even though she has Uni phasic changes without blue/purplish discoloration. - Will consider checking TSH, ANA, rheumatoid factor.  Also serology for MCTD, Sjogren's, scleroderma and dermatomyositis/polymyositis can be considered.   Orders placed this encounter:  No orders of the defined types were placed in this encounter.    Doreatha Massed, MD [facility] [phone]   I, Alda Ponder, am acting as a scribe for Dr. Doreatha Massed.  I, Doreatha Massed MD, have reviewed the above  documentation for accuracy and completeness, and I agree with the above.

## 2021-11-20 ENCOUNTER — Inpatient Hospital Stay (HOSPITAL_COMMUNITY): Payer: 59 | Attending: Hematology | Admitting: Hematology

## 2021-11-20 VITALS — BP 132/87 | HR 82 | Temp 98.8°F | Resp 18 | Ht 65.0 in | Wt 157.0 lb

## 2021-11-20 DIAGNOSIS — D508 Other iron deficiency anemias: Secondary | ICD-10-CM | POA: Diagnosis not present

## 2021-11-20 DIAGNOSIS — D72819 Decreased white blood cell count, unspecified: Secondary | ICD-10-CM | POA: Diagnosis not present

## 2021-11-20 DIAGNOSIS — D509 Iron deficiency anemia, unspecified: Secondary | ICD-10-CM | POA: Diagnosis not present

## 2021-11-20 NOTE — Patient Instructions (Signed)
[facility] Discharge Instructions  You were seen and examined today by Dr. Ellin Saba. Dr. Ellin Saba reviewed your recent lab results, your iron seems to be doing well. Continue your iron supplement regimen.  The numbness and paleness that occurs in your fingers and toes is likely related to Raynaud's. This can occur by itself or in relation to other disorders. We will order labs to rule out anything serious.  Follow-up in one year with Dr. Ellin Saba. Have your iron labs checked in about 6 months.   Thank you for choosing [facility] to provide your oncology and hematology care.  To afford each patient quality time with our provider, please arrive at least 15 minutes before your scheduled appointment time.   If you have a lab appointment with the Cancer Center please come in thru the Main Entrance and check in at the main information desk.  You need to re-schedule your appointment should you arrive 10 or more minutes late.  We strive to give you quality time with our providers, and arriving late affects you and other patients whose appointments are after yours.  Also, if you no show three or more times for appointments you may be dismissed from the clinic at the providers discretion.     Again, thank you for choosing [facility].  Our hope is that these requests will decrease the amount of time that you wait before being seen by our physicians.       _____________________________________________________________  Should you have questions after your visit to [facility], please contact our office at [phone] and follow the prompts.  Our office hours are 8:00 a.m. and 4:30 p.m. Monday - Friday.  Please note that voicemails left after 4:00 p.m. may not be returned until the following business day.  We are closed weekends and major holidays.  You do have access to a nurse 24-7, just call the  main number to the clinic [phone] and do not press any options, hold on the line and a nurse will answer the phone.    For prescription refill requests, have your pharmacy contact our office and allow 72 hours.    Due to Covid, you will need to wear a mask upon entering the hospital. If you do not have a mask, a mask will be given to you at the Main Entrance upon arrival. For doctor visits, patients may have 1 support person age 49 or older with them. For treatment visits, patients can not have anyone with them due to social distancing guidelines and our immunocompromised population.

## 2021-11-24 ENCOUNTER — Other Ambulatory Visit (HOSPITAL_COMMUNITY): Payer: Self-pay

## 2021-11-24 DIAGNOSIS — D508 Other iron deficiency anemias: Secondary | ICD-10-CM

## 2021-11-24 DIAGNOSIS — D72819 Decreased white blood cell count, unspecified: Secondary | ICD-10-CM

## 2021-11-24 NOTE — Progress Notes (Signed)
Lab orders placed per Dr. Katragadda 

## 2021-11-25 ENCOUNTER — Encounter: Payer: Self-pay | Admitting: Hematology

## 2021-11-25 DIAGNOSIS — D508 Other iron deficiency anemias: Secondary | ICD-10-CM | POA: Diagnosis not present

## 2021-11-25 DIAGNOSIS — D72819 Decreased white blood cell count, unspecified: Secondary | ICD-10-CM | POA: Diagnosis not present

## 2021-11-28 ENCOUNTER — Encounter: Payer: 59 | Admitting: Family Medicine

## 2021-12-10 DIAGNOSIS — H52203 Unspecified astigmatism, bilateral: Secondary | ICD-10-CM | POA: Diagnosis not present

## 2021-12-10 DIAGNOSIS — H5213 Myopia, bilateral: Secondary | ICD-10-CM | POA: Diagnosis not present

## 2022-03-18 ENCOUNTER — Telehealth: Payer: 59 | Admitting: Physician Assistant

## 2022-03-18 DIAGNOSIS — H109 Unspecified conjunctivitis: Secondary | ICD-10-CM

## 2022-03-18 MED ORDER — POLYMYXIN B-TRIMETHOPRIM 10000-0.1 UNIT/ML-% OP SOLN: 1.0000 [drp] | OPHTHALMIC | 0 refills | Status: DC

## 2022-03-18 NOTE — Progress Notes (Signed)

## 2022-05-11 ENCOUNTER — Encounter (HOSPITAL_COMMUNITY): Payer: Self-pay | Admitting: Hematology and Oncology

## 2022-10-15 ENCOUNTER — Other Ambulatory Visit: Payer: Self-pay

## 2022-10-15 DIAGNOSIS — D72819 Decreased white blood cell count, unspecified: Secondary | ICD-10-CM

## 2022-10-15 DIAGNOSIS — D508 Other iron deficiency anemias: Secondary | ICD-10-CM

## 2022-11-16 DIAGNOSIS — D508 Other iron deficiency anemias: Secondary | ICD-10-CM | POA: Diagnosis not present

## 2022-11-16 DIAGNOSIS — D72819 Decreased white blood cell count, unspecified: Secondary | ICD-10-CM | POA: Diagnosis not present

## 2022-11-19 ENCOUNTER — Encounter (HOSPITAL_COMMUNITY): Payer: Self-pay | Admitting: Hematology and Oncology

## 2022-11-21 ENCOUNTER — Encounter (HOSPITAL_COMMUNITY): Payer: Self-pay | Admitting: Hematology and Oncology

## 2022-11-23 ENCOUNTER — Ambulatory Visit: Payer: 59 | Admitting: Hematology

## 2022-12-14 DIAGNOSIS — H5213 Myopia, bilateral: Secondary | ICD-10-CM | POA: Diagnosis not present

## 2022-12-28 ENCOUNTER — Encounter (HOSPITAL_COMMUNITY): Payer: Self-pay | Admitting: Hematology and Oncology

## 2023-01-04 ENCOUNTER — Encounter (HOSPITAL_COMMUNITY): Payer: Self-pay | Admitting: Hematology and Oncology

## 2023-02-07 ENCOUNTER — Encounter (HOSPITAL_COMMUNITY): Payer: Self-pay | Admitting: Hematology and Oncology

## 2023-02-12 ENCOUNTER — Other Ambulatory Visit: Payer: Self-pay

## 2023-02-12 DIAGNOSIS — D508 Other iron deficiency anemias: Secondary | ICD-10-CM

## 2023-02-12 DIAGNOSIS — D72819 Decreased white blood cell count, unspecified: Secondary | ICD-10-CM

## 2023-02-12 NOTE — Progress Notes (Signed)
Lab orders placed per Dr. Katragadda 

## 2023-02-18 DIAGNOSIS — D508 Other iron deficiency anemias: Secondary | ICD-10-CM | POA: Diagnosis not present

## 2023-02-18 DIAGNOSIS — D72819 Decreased white blood cell count, unspecified: Secondary | ICD-10-CM | POA: Diagnosis not present

## 2023-02-19 ENCOUNTER — Other Ambulatory Visit: Payer: Self-pay

## 2023-02-19 MED ORDER — FERROUS SULFATE 324 MG PO TBEC: 324.0000 mg | DELAYED_RELEASE_TABLET | Freq: Every day | ORAL | 0 refills | Status: AC

## 2023-04-29 ENCOUNTER — Other Ambulatory Visit: Payer: Self-pay

## 2023-04-29 DIAGNOSIS — D508 Other iron deficiency anemias: Secondary | ICD-10-CM

## 2023-05-20 DIAGNOSIS — D508 Other iron deficiency anemias: Secondary | ICD-10-CM | POA: Diagnosis not present

## 2023-05-23 NOTE — Progress Notes (Signed)
[facility] [address], [place] [address]    Clinic Day:  05/24/2023  Referring physician: No ref. provider found  Patient Care Team: Wynn Banker, MD (Inactive) as PCP - General (Family Medicine) Doreatha Massed, MD as Medical Oncologist (Hematology)   ASSESSMENT & PLAN:   Assessment: 1.  Iron deficiency anemia: - 5 infusions of Venofer from 12/02/2020 through 12/13/2020.  Prior to the infusions, she took ferrous sulfate 65 mg twice daily for almost 1 year. - She denied any heavy menstrual cycles.    Plan: 1.  Iron deficiency anemia: - She is taking iron tablet once a week. - Denies any heavy menstrual blood loss.  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 - Her mother had ATM mutation and breast cancer at age 51.  She did not want to have it checked yet.  She wants to have the test done after she completes school.  I have recommended that she start doing MRI of the breast yearly, around the age of 7. - I have reviewed her labs from 05/20/2023: Hemoglobin-13.2, MCV-96.  Ferritin is 98, percent saturation is 42. - Continue iron tablet once a week.  RTC 1 year for follow-up with repeat labs.  She may also follow-up with her PMD and see [place] only as needed.  2.  ? Raynaud's phenomenon: - She had history of pallor of the fingers of the hand when exposed to cold. - Previous connective tissue disorder workup was negative.  She does not have any symptoms at this time of the year.    No orders of the defined types were placed in this encounter.     I,Katie Daubenspeck,acting as a Neurosurgeon for Doreatha Massed, MD.,have documented all relevant documentation on the behalf of Doreatha Massed, MD,as directed by  Doreatha Massed, MD while in the presence of Doreatha Massed, MD.   I, Doreatha Massed MD, have reviewed the above documentation for accuracy and completeness, and I agree with the above.   Doreatha Massed, MD   6/17/20244:31 PM  CHIEF COMPLAINT:   Diagnosis: IDA    Cancer Staging  No matching staging information was found for the patient.   Prior Therapy: Intermittent Venofer: last on 12/13/2020   Current Therapy:  1/2 iron tablet BID    HISTORY OF PRESENT ILLNESS:   Oncology History   No history exists.     INTERVAL HISTORY:   Anna Mcknight is a 22 y.o. female presenting to clinic today for follow up of IDA. She was last seen by me on 11/20/21.  Today, she states that she is doing well overall. Her appetite level is at 100%. Her energy level is at 100%.  PAST MEDICAL HISTORY:   Past Medical History: Past Medical History:  Diagnosis Date   Iron deficiency     Surgical History: Past Surgical History:  Procedure Laterality Date   WISDOM TOOTH EXTRACTION  2020    Social History: Social History   Socioeconomic History   Marital status: Single    Spouse name: Not on file   Number of children: Not on file   Years of education: Not on file   Highest education level: Not on file  Occupational History   Not on file  Tobacco Use   Smoking status: Never   Smokeless tobacco: Never  Vaping Use   Vaping Use: Never used  Substance and Sexual Activity   Alcohol use: Never   Drug use: Never  Sexual activity: Never  Other Topics Concern   Not on file  Social History Narrative   Not on file   Social Determinants of Health   Financial Resource Strain: Not on file  Food Insecurity: Not on file  Transportation Needs: No Transportation Needs (11/28/2020)   PRAPARE - Administrator, Civil Service (Medical): No    Lack of Transportation (Non-Medical): No  Physical Activity: Inactive (11/28/2020)   Exercise Vital Sign    Days of Exercise per Week: 0 days    Minutes of Exercise per Session: 0 min  Stress: Not on file  Social Connections: Not on file  Intimate Partner Violence: Not At Risk (11/28/2020)   Humiliation, Afraid, Rape, and Kick  questionnaire    Fear of Current or Ex-Partner: No    Emotionally Abused: No    Physically Abused: No    Sexually Abused: No    Family History: Family History  Problem Relation Age of Onset   Breast cancer Mother 57   Heart disease Father    Hypertension Father    Hyperlipidemia Maternal Grandmother    Hypertension Maternal Grandmother    Breast cancer Maternal Grandmother 76   Prostate cancer Maternal Grandfather    Diabetes Maternal Grandfather    Stomach cancer Paternal Grandmother    Hyperlipidemia Paternal Grandfather    Hypertension Paternal Grandfather     Current Medications:  Current Outpatient Medications:    ferrous sulfate 324 MG TBEC, Take 1 tablet (324 mg total) by mouth daily with breakfast., Disp: 30 tablet, Rfl: 0   trimethoprim-polymyxin b (POLYTRIM) ophthalmic solution, Place 1 drop into the left eye every 4 (four) hours. X 5 days, Disp: 10 mL, Rfl: 0   Allergies: No Known Allergies  REVIEW OF SYSTEMS:   Review of Systems  Constitutional:  Negative for chills, fatigue and fever.  HENT:   Negative for lump/mass, mouth sores, nosebleeds, sore throat and trouble swallowing.   Eyes:  Negative for eye problems.  Respiratory:  Negative for cough and shortness of breath.   Cardiovascular:  Negative for chest pain, leg swelling and palpitations.  Gastrointestinal:  Negative for abdominal pain, constipation, diarrhea, nausea and vomiting.  Genitourinary:  Negative for bladder incontinence, difficulty urinating, dysuria, frequency, hematuria and nocturia.   Musculoskeletal:  Negative for arthralgias, back pain, flank pain, myalgias and neck pain.  Skin:  Negative for itching and rash.  Neurological:  Negative for dizziness, headaches and numbness.  Hematological:  Does not bruise/bleed easily.  Psychiatric/Behavioral:  Negative for depression, sleep disturbance and suicidal ideas. The patient is not nervous/anxious.   All other systems reviewed and are  negative.    VITALS:   Blood pressure 132/86, pulse 89, temperature 98.6 F (37 C), temperature source Oral, resp. rate 16, weight 158 lb 14.4 oz (72.1 kg), SpO2 100 %.  Wt Readings from Last 3 Encounters:  05/24/23 158 lb 14.4 oz (72.1 kg)  11/20/21 157 lb (71.2 kg)  05/07/21 159 lb 4.8 oz (72.3 kg) (87 %, Z= 1.13)*   * Growth percentiles are based on CDC (Girls, 2-20 Years) data.    Body mass index is 26.44 kg/m.  Performance status (ECOG): 0 - Asymptomatic  PHYSICAL EXAM:   Physical Exam Vitals and nursing note reviewed. Exam conducted with a chaperone present.  Constitutional:      Appearance: Normal appearance.  Cardiovascular:     Rate and Rhythm: Normal rate and regular rhythm.     Pulses: Normal pulses.  Heart sounds: Normal heart sounds.  Pulmonary:     Effort: Pulmonary effort is normal.     Breath sounds: Normal breath sounds.  Abdominal:     Palpations: Abdomen is soft. There is no hepatomegaly, splenomegaly or mass.     Tenderness: There is no abdominal tenderness.  Musculoskeletal:     Right lower leg: No edema.     Left lower leg: No edema.  Lymphadenopathy:     Cervical: No cervical adenopathy.     Right cervical: No superficial, deep or posterior cervical adenopathy.    Left cervical: No superficial, deep or posterior cervical adenopathy.     Upper Body:     Right upper body: No supraclavicular or axillary adenopathy.     Left upper body: No supraclavicular or axillary adenopathy.  Neurological:     General: No focal deficit present.     Mental Status: She is alert and oriented to person, place, and time.  Psychiatric:        Mood and Affect: Mood normal.        Behavior: Behavior normal.     LABS:      Latest Ref Rng & Units 11/25/2020   10:05 AM 04/26/2020    9:15 AM 01/29/2020    4:39 PM  CBC  WBC 3.8 - 10.8 Thousand/uL 3.3  4.2  7.4   Hemoglobin 11.7 - 15.5 g/dL 96.0  45.4  09.8   Hematocrit 35.0 - 45.0 % 37.8  39.5  34.6    Platelets 140 - 400 Thousand/uL 282  277  338       Latest Ref Rng & Units 11/25/2020   10:05 AM  CMP  Glucose 65 - 99 mg/dL 86   BUN 7 - 20 mg/dL 8   Creatinine 1.19 - 1.47 mg/dL 8.29   Sodium 562 - 130 mmol/L 141   Potassium 3.8 - 5.1 mmol/L 4.3   Chloride 98 - 110 mmol/L 106   CO2 20 - 32 mmol/L 25   Calcium 8.9 - 10.4 mg/dL 9.5   Total Protein 6.3 - 8.2 g/dL 7.6   Total Bilirubin 0.2 - 1.1 mg/dL 0.4   AST 12 - 32 U/L 25   ALT 5 - 32 U/L 32      No results found for: "CEA1", "CEA" / No results found for: "CEA1", "CEA" No results found for: "PSA1" No results found for: "QMV784" No results found for: "CAN125"  No results found for: "TOTALPROTELP", "ALBUMINELP", "A1GS", "A2GS", "BETS", "BETA2SER", "GAMS", "MSPIKE", "SPEI" Lab Results  Component Value Date   TIBC 368 11/25/2020   TIBC 359 04/26/2020   TIBC 394 01/29/2020   FERRITIN 16 11/25/2020   FERRITIN 13 (L) 04/26/2020   FERRITIN 10 (L) 01/29/2020   IRONPCTSAT 30 11/25/2020   IRONPCTSAT 33 04/26/2020   IRONPCTSAT 14 (L) 01/29/2020   No results found for: "LDH"   STUDIES:   No results found.

## 2023-05-24 ENCOUNTER — Inpatient Hospital Stay: Payer: Commercial Managed Care - PPO | Attending: Hematology | Admitting: Hematology

## 2023-05-24 VITALS — BP 132/86 | HR 89 | Temp 98.6°F | Resp 16 | Wt 158.9 lb

## 2023-05-24 DIAGNOSIS — Z803 Family history of malignant neoplasm of breast: Secondary | ICD-10-CM | POA: Diagnosis not present

## 2023-05-24 DIAGNOSIS — D508 Other iron deficiency anemias: Secondary | ICD-10-CM | POA: Diagnosis not present

## 2023-05-24 DIAGNOSIS — Z8042 Family history of malignant neoplasm of prostate: Secondary | ICD-10-CM | POA: Insufficient documentation

## 2023-05-24 DIAGNOSIS — Z800 Family history of malignant neoplasm of digestive organs: Secondary | ICD-10-CM | POA: Diagnosis not present

## 2023-05-24 DIAGNOSIS — D509 Iron deficiency anemia, unspecified: Secondary | ICD-10-CM | POA: Diagnosis not present

## 2023-05-24 NOTE — Patient Instructions (Addendum)
[facility]  Discharge Instructions  You were seen and examined today by Dr. Ellin Saba.  Please continue taking your iron as prescribed, it is keeping your levels within normal range. There is no need for additional iron infusions at this time.  Follow-up as scheduled.  Thank you for choosing  [facility] to provide your oncology and hematology care.   To afford each patient quality time with our provider, please arrive at least 15 minutes before your scheduled appointment time. You may need to reschedule your appointment if you arrive late (10 or more minutes). Arriving late affects you and other patients whose appointments are after yours.  Also, if you miss three or more appointments without notifying the office, you may be dismissed from the clinic at the provider's discretion.    Again, thank you for choosing [facility].  Our hope is that these requests will decrease the amount of time that you wait before being seen by our physicians.   If you have a lab appointment with the Cancer Center - please note that after April 8th, all labs will be drawn in the cancer center.  You do not have to check in or register with the main entrance as you have in the past but will complete your check-in at the cancer center.            _____________________________________________________________  Should you have questions after your visit to [facility], please contact our office at [phone] and follow the prompts.  Our office hours are 8:00 a.m. to 4:30 p.m. Monday - Thursday and 8:00 a.m. to 2:30 p.m. Friday.  Please note that voicemails left after 4:00 p.m. may not be returned until the following business day.  We are closed weekends and all major holidays.  You do have access to a nurse 24-7, just call the main number to the clinic [phone] and do not press any options, hold on the line and a nurse will answer the  phone.    For prescription refill requests, have your pharmacy contact our office and allow 72 hours.    Masks are no longer required in the cancer centers. If you would like for your care team to wear a mask while they are taking care of you, please let them know. You may have one support person who is at least 22 years old accompany you for your appointments.

## 2023-06-04 ENCOUNTER — Other Ambulatory Visit: Payer: Self-pay

## 2023-06-04 DIAGNOSIS — Z111 Encounter for screening for respiratory tuberculosis: Secondary | ICD-10-CM

## 2023-06-04 NOTE — Progress Notes (Signed)
TB lab test for screening for patient's university preparation.

## 2023-06-11 DIAGNOSIS — Z111 Encounter for screening for respiratory tuberculosis: Secondary | ICD-10-CM | POA: Diagnosis not present

## 2023-06-29 ENCOUNTER — Other Ambulatory Visit: Payer: Self-pay

## 2023-06-29 DIAGNOSIS — Z1159 Encounter for screening for other viral diseases: Secondary | ICD-10-CM

## 2023-06-30 DIAGNOSIS — Z1159 Encounter for screening for other viral diseases: Secondary | ICD-10-CM | POA: Diagnosis not present

## 2023-07-05 ENCOUNTER — Other Ambulatory Visit: Payer: Self-pay

## 2023-07-05 DIAGNOSIS — Z1159 Encounter for screening for other viral diseases: Secondary | ICD-10-CM | POA: Diagnosis not present

## 2023-07-12 ENCOUNTER — Encounter: Payer: Self-pay | Admitting: Hematology

## 2023-08-06 ENCOUNTER — Other Ambulatory Visit: Payer: Self-pay

## 2023-08-06 DIAGNOSIS — Z1159 Encounter for screening for other viral diseases: Secondary | ICD-10-CM

## 2023-08-17 DIAGNOSIS — Z23 Encounter for immunization: Secondary | ICD-10-CM | POA: Diagnosis not present

## 2023-08-27 DIAGNOSIS — Z1159 Encounter for screening for other viral diseases: Secondary | ICD-10-CM | POA: Diagnosis not present

## 2024-01-14 ENCOUNTER — Other Ambulatory Visit: Payer: Self-pay

## 2024-01-14 ENCOUNTER — Inpatient Hospital Stay: Payer: Commercial Managed Care - PPO | Attending: Hematology

## 2024-01-14 DIAGNOSIS — D509 Iron deficiency anemia, unspecified: Secondary | ICD-10-CM | POA: Insufficient documentation

## 2024-01-14 DIAGNOSIS — D508 Other iron deficiency anemias: Secondary | ICD-10-CM

## 2024-01-14 DIAGNOSIS — R42 Dizziness and giddiness: Secondary | ICD-10-CM

## 2024-01-14 LAB — COMPREHENSIVE METABOLIC PANEL
ALT: 26 U/L (ref 0–44)
AST: 25 U/L (ref 15–41)
Albumin: 4.8 g/dL (ref 3.5–5.0)
Alkaline Phosphatase: 44 U/L (ref 38–126)
Anion gap: 12 (ref 5–15)
BUN: 8 mg/dL (ref 6–20)
CO2: 22 mmol/L (ref 22–32)
Calcium: 9.7 mg/dL (ref 8.9–10.3)
Chloride: 104 mmol/L (ref 98–111)
Creatinine, Ser: 0.63 mg/dL (ref 0.44–1.00)
GFR, Estimated: >60 mL/min (ref >60)
Glucose, Bld: 96 mg/dL (ref 70–99)
Potassium: 3.7 mmol/L (ref 3.5–5.1)
Sodium: 138 mmol/L (ref 135–145)
Total Bilirubin: 0.6 mg/dL (ref 0.0–1.2)
Total Protein: 8.5 g/dL — ABNORMAL HIGH (ref 6.5–8.1)

## 2024-01-14 LAB — CBC WITH DIFFERENTIAL/PLATELET
Abs Immature Granulocytes: 0.01 10*3/uL (ref 0.00–0.07)
Basophils Absolute: 0 10*3/uL (ref 0.0–0.1)
Basophils Relative: 1 %
Eosinophils Absolute: 0.1 10*3/uL (ref 0.0–0.5)
Eosinophils Relative: 1 %
HCT: 40.8 % (ref 36.0–46.0)
Hemoglobin: 13.5 g/dL (ref 12.0–15.0)
Immature Granulocytes: 0 %
Lymphocytes Relative: 23 %
Lymphs Abs: 1.5 10*3/uL (ref 0.7–4.0)
MCH: 31.5 pg (ref 26.0–34.0)
MCHC: 33.1 g/dL (ref 30.0–36.0)
MCV: 95.1 fL (ref 80.0–100.0)
Monocytes Absolute: 0.4 10*3/uL (ref 0.1–1.0)
Monocytes Relative: 7 %
Neutro Abs: 4.5 10*3/uL (ref 1.7–7.7)
Neutrophils Relative %: 68 %
Platelets: 312 10*3/uL (ref 150–400)
RBC: 4.29 MIL/uL (ref 3.87–5.11)
RDW: 11.4 % — ABNORMAL LOW (ref 11.5–15.5)
WBC: 6.5 10*3/uL (ref 4.0–10.5)
nRBC: 0 % (ref 0.0–0.2)

## 2024-01-14 LAB — IRON AND TIBC
Iron: 101 ug/dL (ref 28–170)
Saturation Ratios: 29 % (ref 10.4–31.8)
TIBC: 353 ug/dL (ref 250–450)
UIBC: 252 ug/dL

## 2024-01-14 LAB — FERRITIN: Ferritin: 42 ng/mL (ref 11–307)

## 2024-01-15 ENCOUNTER — Telehealth: Payer: Commercial Managed Care - PPO

## 2024-01-15 ENCOUNTER — Telehealth: Payer: Commercial Managed Care - PPO | Admitting: Urgent Care

## 2024-01-15 DIAGNOSIS — H8110 Benign paroxysmal vertigo, unspecified ear: Secondary | ICD-10-CM | POA: Diagnosis not present

## 2024-01-15 NOTE — Patient Instructions (Signed)
 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  Please take the Dramamine you have at home; 1-2 tabs every 4-6 hours. Do NOT exceed 8 tabs in a 24 hour period. You can take 30-60 min prior to activity if needed.  If any new or worsening symptoms, please head to the ER. Please follow up with student health on Monday if symptoms fail to respond to dramamine.

## 2024-01-15 NOTE — Progress Notes (Signed)
 Virtual Visit via Video Note  I connected with Anna Mcknight on 01/15/24 at  9:30 PM EST by a video enabled telemedicine application and verified that I am speaking with the correct person using two identifiers.  Patient Location: Home Provider Location: Home Office  I discussed the limitations, risks, security, and privacy concerns of performing an evaluation and management service by video and the availability of in person appointments. I also discussed with the patient that there may be a patient responsible charge related to this service. The patient expressed understanding and agreed to proceed.   Subjective  PCP: Koberlein, Junell C, MD (Inactive)  No chief complaint on file.   HPI:  Pleasant 23 year old female with a past medical history of iron  deficiency presents due to concerns of lightheadedness/dizziness.  She states it started yesterday first thing in the morning.  She had eaten and was well-hydrated.  It lasted for a short period.  She then got in the car to drive home for the weekend, and states it happened again while driving and almost had to pull over.  She notes it more when she is up moving around, moving her head, and does not notice it when she is sitting still.  At 1 point she did become nauseous, but denies any vomiting.  No diarrhea.  States her mom is a engineer, civil (consulting) and works at a research [employer], trade [employer], she went to her mom's work yesterday and had labs drawn just to ensure she is no longer anemic.  CBC, CMP, iron , and ferritin all unremarkable.  Patient denies any issues with glucose.  No history of hypoglycemia.  She denies any high or low blood pressure.  She denies a headache, change in vision, tinnitus, sinus pressure, postnasal drainage, or any additional URI symptoms.  She admits to feeling significantly better today than she did yesterday, but wanted to inquire about the symptoms.  She states at 1 point she was seen at the health clinic on campus, and was told she had  fluid in her ears, but she denies treatment for it in the past.  Patient denies any shortness of breath chest pain or palpitations.  No treatments have been tried for her current symptoms.  ROS: Per HPI. All other pertinent systems are negative.   Current Outpatient Medications:    ferrous sulfate  324 MG TBEC, Take 1 tablet (324 mg total) by mouth daily with breakfast., Disp: 30 tablet, Rfl: 0   trimethoprim -polymyxin b  (POLYTRIM ) ophthalmic solution, Place 1 drop into the left eye every 4 (four) hours. X 5 days, Disp: 10 mL, Rfl: 0    Objective  Vital signs not able to be obtained due to this being a virtual visit.  Physical Exam Constitutional:      General: She is not in acute distress.    Appearance: Normal appearance. She is normal weight. She is not ill-appearing, toxic-appearing or diaphoretic.  HENT:     Head: Normocephalic and atraumatic.     Nose: Nose normal.  Eyes:     General: No scleral icterus.       Right eye: No discharge.        Left eye: No discharge.  Pulmonary:     Effort: Pulmonary effort is normal. No respiratory distress.     Breath sounds: No stridor.  Musculoskeletal:     Cervical back: Normal range of motion.  Skin:    General: Skin is warm.     Coloration: Skin is not jaundiced.  Findings: No bruising, erythema or rash.  Neurological:     General: No focal deficit present.     Mental Status: She is alert and oriented to person, place, and time.  Psychiatric:        Mood and Affect: Mood normal.        Behavior: Behavior normal.      Assessment & Plan Benign paroxysmal positional vertigo, unspecified laterality  1. Benign paroxysmal positional vertigo, unspecified laterality  Clinically, I suspect a BPPV based on the intermittent nature, worse with movement. Pt is denying red flag s/sx. Looks well upon exam.  Please take the Dramamine you have at home; 1-2 tabs every 4-6 hours. Do NOT exceed 8 tabs in a 24 hour period. You can take 30-60  min prior to activity if needed.  If any new or worsening symptoms, please head to the ER. Please follow up with student health on Monday if symptoms fail to respond to dramamine.  No follow-ups on file.   I discussed the assessment and treatment plan with the patient. The patient was provided an opportunity to ask questions, and all were answered. The patient agreed with the plan and demonstrated an understanding of the instructions.   The patient was advised to call back or seek an in-person evaluation if the symptoms worsen or if the condition fails to improve as anticipated.  The above assessment and management plan was discussed with the patient. The patient verbalized understanding of and has agreed to the management plan.   Benton LITTIE Gave, PA

## 2024-01-17 DIAGNOSIS — H52203 Unspecified astigmatism, bilateral: Secondary | ICD-10-CM | POA: Diagnosis not present

## 2024-01-17 DIAGNOSIS — R4189 Other symptoms and signs involving cognitive functions and awareness: Secondary | ICD-10-CM | POA: Diagnosis not present

## 2024-01-17 DIAGNOSIS — Z1330 Encounter for screening examination for mental health and behavioral disorders, unspecified: Secondary | ICD-10-CM | POA: Diagnosis not present

## 2024-01-17 DIAGNOSIS — R42 Dizziness and giddiness: Secondary | ICD-10-CM | POA: Diagnosis not present

## 2024-01-17 DIAGNOSIS — H5213 Myopia, bilateral: Secondary | ICD-10-CM | POA: Diagnosis not present

## 2024-02-10 DIAGNOSIS — F411 Generalized anxiety disorder: Secondary | ICD-10-CM | POA: Diagnosis not present

## 2024-02-14 ENCOUNTER — Encounter (HOSPITAL_COMMUNITY): Payer: Self-pay | Admitting: Hematology and Oncology

## 2024-03-03 DIAGNOSIS — H43813 Vitreous degeneration, bilateral: Secondary | ICD-10-CM | POA: Diagnosis not present

## 2024-03-07 ENCOUNTER — Other Ambulatory Visit (HOSPITAL_COMMUNITY): Payer: Self-pay

## 2024-03-07 ENCOUNTER — Encounter (HOSPITAL_COMMUNITY): Payer: Self-pay | Admitting: Hematology and Oncology

## 2024-03-07 ENCOUNTER — Other Ambulatory Visit (HOSPITAL_BASED_OUTPATIENT_CLINIC_OR_DEPARTMENT_OTHER): Payer: Self-pay

## 2024-03-07 DIAGNOSIS — F411 Generalized anxiety disorder: Secondary | ICD-10-CM | POA: Diagnosis not present

## 2024-03-08 ENCOUNTER — Other Ambulatory Visit (HOSPITAL_COMMUNITY): Payer: Self-pay

## 2024-03-08 ENCOUNTER — Other Ambulatory Visit: Payer: Self-pay

## 2024-03-08 MED ORDER — SERTRALINE HCL 50 MG PO TABS
50.0000 mg | ORAL_TABLET | Freq: Every day | ORAL | 0 refills | Status: AC
  Filled 2024-03-08 (×2): qty 90, 90d supply, fill #0

## 2024-03-09 ENCOUNTER — Other Ambulatory Visit: Payer: Self-pay

## 2024-03-09 ENCOUNTER — Other Ambulatory Visit (HOSPITAL_COMMUNITY): Payer: Self-pay

## 2024-04-10 ENCOUNTER — Encounter: Payer: Self-pay | Admitting: Family Medicine

## 2024-04-10 ENCOUNTER — Ambulatory Visit: Payer: Commercial Managed Care - PPO | Admitting: Family Medicine

## 2024-04-10 VITALS — BP 130/88 | HR 95 | Temp 98.4°F | Ht 66.0 in | Wt 158.6 lb

## 2024-04-10 DIAGNOSIS — F419 Anxiety disorder, unspecified: Secondary | ICD-10-CM | POA: Insufficient documentation

## 2024-04-10 NOTE — Progress Notes (Signed)
 New Patient Office Visit  Subjective   Patient ID: Anna Mcknight, female    DOB: 03-11-2001  Age: 23 y.o. MRN: [id number]  CC:  Chief Complaint  Patient presents with   Establish Care    HPI Anna Mcknight presents to establish care Pt originally saw Dr. Koberlein in 2021, states that she went to college and was unable to follow up during that time.   Pt reports that she started taking sertraline  about 2 months ago through her school, states she was going to the NP at [facility]. States that she has been having alot of brain fog, tension in her neck and the back of her head. States she was also getting heart palpitations, with have improved since starting the sertraline . States that since then she still is having a lot of brain fog and she is still trying to work through that. States that the sertraline  has improved the brain fog somewhat, sometimes happens mostly when she is driving. Sometimes feels like she isn't always "present". Is sleeping well at night, feels rested when she wakes up in the morning. States that she used to drink a lot of coffee but stopped doing this about 2 months ago. States she did see a therapist who stated that she leans towards OCD symptoms, was screened for ADHD  Pt has a history of iron  deficiency in the past, states she had her labs checked but they were normal the last time. Has been seen at the cancer center in the past for this.  Family hx-- pt reports her mother had breast CA at age 81, grandfather passed of pancreatic cancer also. Mother has the ATM gene mutation.  Reviewed HM, has never had a pap smear, is not sexually active and did receive the HPV vaccinations. We discussed the pap smear and recommendations on this test.   Current Outpatient Medications  Medication Instructions   ferrous sulfate  324 mg, Oral, Daily with breakfast   loratadine (CLARITIN) 10 mg, Daily   sertraline  (ZOLOFT ) 50 mg, Oral, Daily    Past Medical History:   Diagnosis Date   Anxiety    Iron  deficiency     Past Surgical History:  Procedure Laterality Date   WISDOM TOOTH EXTRACTION  2020    Family History  Problem Relation Age of Onset   Breast cancer Mother 20   Heart disease Father    Hypertension Father    Hyperlipidemia Maternal Grandmother    Hypertension Maternal Grandmother    Breast cancer Maternal Grandmother 32   Prostate cancer Maternal Grandfather    Diabetes Maternal Grandfather    Stomach cancer Paternal Grandmother    Hyperlipidemia Paternal Grandfather    Hypertension Paternal Grandfather     Social History   Socioeconomic History   Marital status: Single    Spouse name: Not on file   Number of children: Not on file   Years of education: Not on file   Highest education level: Bachelor's degree (e.g., BA, AB, BS)  Occupational History   Not on file  Tobacco Use   Smoking status: Never   Smokeless tobacco: Never  Vaping Use   Vaping status: Never Used  Substance and Sexual Activity   Alcohol use: Never   Drug use: Never   Sexual activity: Never  Other Topics Concern   Not on file  Social History Narrative   Not on file   Social Drivers of Health   Financial Resource Strain: Low Risk  (04/03/2024)   Overall  Financial Resource Strain (CARDIA)    Difficulty of Paying Living Expenses: Not hard at all  Food Insecurity: No Food Insecurity (04/03/2024)   Hunger Vital Sign    Worried About Running Out of Food in the Last Year: Never true    Ran Out of Food in the Last Year: Never true  Transportation Needs: No Transportation Needs (04/03/2024)   PRAPARE - Administrator, Civil Service (Medical): No    Lack of Transportation (Non-Medical): No  Physical Activity: Insufficiently Active (04/03/2024)   Exercise Vital Sign    Days of Exercise per Week: 3 days    Minutes of Exercise per Session: 30 min  Stress: Stress Concern Present (04/03/2024)   [employer] of Occupational Health -  Occupational Stress Questionnaire    Feeling of Stress : Rather much  Social Connections: Moderately Integrated (04/03/2024)   Social Connection and Isolation Panel [NHANES]    Frequency of Communication with Friends and Family: More than three times a week    Frequency of Social Gatherings with Friends and Family: More than three times a week    Attends Religious Services: More than 4 times per year    Active Member of [employer] or Organizations: Yes    Attends Banker Meetings: More than 4 times per year    Marital Status: Never married  Intimate Partner Violence: Not At Risk (11/28/2020)   Humiliation, Afraid, Rape, and Kick questionnaire    Fear of Current or Ex-Partner: No    Emotionally Abused: No    Physically Abused: No    Sexually Abused: No    Review of Systems  All other systems reviewed and are negative.       Objective   BP 130/88   Pulse 95   Temp 98.4 F (36.9 C) (Oral)   Ht 5\' 6"  (1.676 m)   Wt 158 lb 9.6 oz (71.9 kg)   LMP 03/22/2024 (Exact Date)   SpO2 97%   BMI 25.60 kg/m   Physical Exam Vitals reviewed.  Constitutional:      Appearance: Normal appearance. She is well-groomed and normal weight.  Eyes:     Conjunctiva/sclera: Conjunctivae normal.  Neck:     Thyroid : No thyromegaly.  Cardiovascular:     Rate and Rhythm: Normal rate and regular rhythm.     Pulses: Normal pulses.     Heart sounds: S1 normal and S2 normal.  Pulmonary:     Effort: Pulmonary effort is normal.     Breath sounds: Normal breath sounds and air entry.  Abdominal:     General: Bowel sounds are normal.  Musculoskeletal:     Right lower leg: No edema.     Left lower leg: No edema.  Neurological:     Mental Status: She is alert and oriented to person, place, and time. Mental status is at baseline.     Gait: Gait is intact.  Psychiatric:        Mood and Affect: Mood and affect normal.        Speech: Speech normal.        Behavior: Behavior normal.         Judgment: Judgment normal.     Last metabolic panel Lab Results  Component Value Date   GLUCOSE 96 01/14/2024   NA 138 01/14/2024   K 3.7 01/14/2024   CL 104 01/14/2024   CO2 22 01/14/2024   BUN 8 01/14/2024   CREATININE 0.63 01/14/2024   GFRNONAA >60 01/14/2024  CALCIUM 9.7 01/14/2024   PROT 8.5 (H) 01/14/2024   ALBUMIN 4.8 01/14/2024   BILITOT 0.6 01/14/2024   ALKPHOS 44 01/14/2024   AST 25 01/14/2024   ALT 26 01/14/2024   ANIONGAP 12 01/14/2024        Assessment & Plan:  Anxiety Assessment & Plan: Sees a psych NP at her college, is only taking 25 mg of the sertraline  daily at this time. The brain fog could be improved if she increased the dose to 50 mg. I advised that she should try this. Also it could be from lack of  attention and focus, I gave her the assessment forms for possible ADHD and I counseled her on this. She will fill them out and return them for my review.      Return in about 1 year (around 04/10/2025) for annual physical exam.   Aida House, MD

## 2024-04-10 NOTE — Assessment & Plan Note (Signed)
 Sees a psych NP at her college, is only taking 25 mg of the sertraline  daily at this time. The brain fog could be improved if she increased the dose to 50 mg. I advised that she should try this. Also it could be from lack of  attention and focus, I gave her the assessment forms for possible ADHD and I counseled her on this. She will fill them out and return them for my review.

## 2024-04-24 DIAGNOSIS — Z111 Encounter for screening for respiratory tuberculosis: Secondary | ICD-10-CM | POA: Diagnosis not present

## 2024-05-08 DIAGNOSIS — F411 Generalized anxiety disorder: Secondary | ICD-10-CM | POA: Diagnosis not present

## 2024-06-12 DIAGNOSIS — F411 Generalized anxiety disorder: Secondary | ICD-10-CM | POA: Diagnosis not present

## 2024-06-17 ENCOUNTER — Encounter: Payer: Self-pay | Admitting: Family Medicine

## 2024-06-17 DIAGNOSIS — R4189 Other symptoms and signs involving cognitive functions and awareness: Secondary | ICD-10-CM

## 2024-06-21 ENCOUNTER — Other Ambulatory Visit: Payer: Self-pay | Admitting: *Deleted

## 2024-06-21 ENCOUNTER — Telehealth: Payer: Self-pay | Admitting: *Deleted

## 2024-06-21 DIAGNOSIS — D508 Other iron deficiency anemias: Secondary | ICD-10-CM

## 2024-06-21 NOTE — Telephone Encounter (Signed)
 Patient advised that she is extremely tired and fatigued, more so than usual.  Will obtain labs and consult with Dr. Rogers.

## 2024-06-22 ENCOUNTER — Other Ambulatory Visit: Payer: Self-pay | Admitting: *Deleted

## 2024-07-16 ENCOUNTER — Other Ambulatory Visit: Payer: Self-pay | Admitting: Medical Genetics

## 2024-07-17 ENCOUNTER — Other Ambulatory Visit (HOSPITAL_COMMUNITY)
Admission: RE | Admit: 2024-07-17 | Discharge: 2024-07-17 | Disposition: A | Discharge status: Home / Self Care | Payer: Self-pay | Source: Ambulatory Visit | Attending: Medical Genetics | Admitting: Medical Genetics

## 2024-07-17 ENCOUNTER — Other Ambulatory Visit (HOSPITAL_BASED_OUTPATIENT_CLINIC_OR_DEPARTMENT_OTHER): Payer: Self-pay

## 2024-07-17 MED ORDER — SODIUM FLUORIDE 1.1 % DT CREA
TOPICAL_CREAM | DENTAL | 3 refills | Status: AC
  Filled 2024-07-17: qty 51, 30d supply, fill #0

## 2024-07-20 ENCOUNTER — Other Ambulatory Visit (HOSPITAL_COMMUNITY)

## 2024-07-25 LAB — GENECONNECT MOLECULAR SCREEN: Genetic Analysis Overall Interpretation: NEGATIVE

## 2024-07-31 ENCOUNTER — Other Ambulatory Visit (HOSPITAL_COMMUNITY): Payer: Self-pay

## 2024-07-31 ENCOUNTER — Other Ambulatory Visit: Payer: Self-pay

## 2024-07-31 MED ORDER — SERTRALINE HCL 50 MG PO TABS
50.0000 mg | ORAL_TABLET | Freq: Every day | ORAL | 0 refills | Status: DC
  Filled 2024-07-31: qty 30, 30d supply, fill #0

## 2024-08-24 ENCOUNTER — Other Ambulatory Visit (HOSPITAL_BASED_OUTPATIENT_CLINIC_OR_DEPARTMENT_OTHER): Payer: Self-pay

## 2024-08-24 DIAGNOSIS — F411 Generalized anxiety disorder: Secondary | ICD-10-CM | POA: Diagnosis not present

## 2024-08-24 MED ORDER — SERTRALINE HCL 50 MG PO TABS
50.0000 mg | ORAL_TABLET | Freq: Every day | ORAL | 0 refills | Status: DC
  Filled 2024-08-24 (×2): qty 90, 90d supply, fill #0

## 2024-09-01 ENCOUNTER — Other Ambulatory Visit (HOSPITAL_COMMUNITY)
Admission: RE | Admit: 2024-09-01 | Discharge: 2024-09-01 | Disposition: A | Source: Ambulatory Visit | Attending: Obstetrics and Gynecology | Admitting: Obstetrics and Gynecology

## 2024-09-01 ENCOUNTER — Encounter: Payer: Self-pay | Admitting: Obstetrics and Gynecology

## 2024-09-01 ENCOUNTER — Ambulatory Visit: Admitting: Obstetrics and Gynecology

## 2024-09-01 VITALS — BP 133/88 | HR 94 | Ht 66.0 in | Wt 167.0 lb

## 2024-09-01 DIAGNOSIS — Z01419 Encounter for gynecological examination (general) (routine) without abnormal findings: Secondary | ICD-10-CM | POA: Insufficient documentation

## 2024-09-01 DIAGNOSIS — Z1331 Encounter for screening for depression: Secondary | ICD-10-CM

## 2024-09-01 NOTE — Progress Notes (Signed)
 Increase pain with menses past 3 years. Tylenol not helpful. No change in bleeding amount. Remains regular onset. Mom with breast CA at 48yr. Mom and aunt carry gene for ATM. PHQ 1  GAD  7

## 2024-09-01 NOTE — Patient Instructions (Addendum)
 It was nice meeting you today! You will see your results in the MyChart app within 1 week.  Try taking ibuprofen 600mg  (3 pills) every 8 hours the day BEFORE your period and the first 1-2 days of your period. If that doesn't get your pain under control, please let me know.

## 2024-09-01 NOTE — Progress Notes (Signed)
 ANNUAL EXAM Patient name: Anna Mcknight MRN [id number]  Date of birth: January 17, 2001 Chief Complaint:   Gynecologic Exam  History of Present Illness:   Anna Mcknight is a 23 y.o. G0P0000 with Patient's last menstrual period was 08/13/2024 (exact date). being seen today for a routine annual exam.  Current complaints:  Painful periods x 3 years, regular cycle Never sexually active   Upstream - 09/01/24 1059       Pregnancy Intention Screening   Does the patient want to become pregnant in the next year? No    Does the patient's partner want to become pregnant in the next year? No    Would the patient like to discuss contraceptive options today? N/A      Contraception Wrap Up   Current Method Abstinence    End Method Abstinence    Contraception Counseling Provided No    How was the end contraceptive method provided? N/A         The pregnancy intention screening data noted above was reviewed. Potential methods of contraception were discussed. The patient elected to proceed with Abstinence.   Last pap never Last mammogram: Mom with breast Ca at 36 w/ ATM mutation. Plans genetic testing in about 1 year.  Last colonoscopy: n/a, no fhx HPV vaccine: completed     04/10/2024    9:14 AM 11/25/2020    9:21 AM 05/22/2020    9:01 AM  Depression screen PHQ 2/9  Decreased Interest 1 0 0  Down, Depressed, Hopeless 0 0 0  PHQ - 2 Score 1 0 0  Altered sleeping 0    Tired, decreased energy 1    Change in appetite 0    Feeling bad or failure about yourself  0    Trouble concentrating 1    Moving slowly or fidgety/restless 0    Suicidal thoughts 0    PHQ-9 Score 3          04/10/2024    9:15 AM  GAD 7 : Generalized Anxiety Score  Nervous, Anxious, on Edge 1  Control/stop worrying 1  Worry too much - different things 3  Trouble relaxing 3  Restless 0  Easily annoyed or irritable 0  Afraid - awful might happen 0  Total GAD 7 Score 8     Review of Systems:   Pertinent  items are noted in HPI Denies any headaches, blurred vision, fatigue, shortness of breath, chest pain, abdominal pain, abnormal vaginal discharge/itching/odor/irritation, problems with periods, bowel movements, urination, or intercourse unless otherwise stated above. Pertinent History Reviewed:  Reviewed past medical,surgical, social and family history.  Reviewed problem list, medications and allergies. Physical Assessment:   Vitals:   09/01/24 1007  BP: 133/88  Pulse: 94  Weight: 167 lb (75.8 kg)  Height: 5' 6 (1.676 m)  Body mass index is 26.95 kg/m.        Physical Examination:   General appearance - well appearing, and in no distress  Mental status - alert, oriented to person, place, and time  Chest - respiratory effort normal  Heart - normal peripheral perfusion  Breasts - breasts appear normal, no suspicious masses, no skin or nipple changes or axillary nodes  Abdomen - soft, nontender, nondistended, no masses or organomegaly  Pelvic - VULVA: normal appearing vulva with no masses, tenderness or lesions  VAGINA: normal appearing vagina with normal color and discharge, no lesions  CERVIX: normal appearing cervix without discharge or lesions  Thin prep pap is done with  reflex HR HPV cotesting  Chaperone present for exam  No results found for this or any previous visit (from the past 24 hours).  Assessment & Plan:  1) Well-Woman Exam Mammogram: MMG/US  pending cancer genetic testing. Likely due to start around 34. Will send mychart when she is ready for referral to cancer genetics & genetic testing. Colonoscopy: @ 23yo, or sooner if problems Pap: Collected Gardasil: Completed GC/CT: n/a HIV/HCV: n/a Discussed scheduled NSAIDs for painful periods, RTC if no improvement  Labs/procedures today:   No orders of the defined types were placed in this encounter.  Meds: No orders of the defined types were placed in this encounter.  Follow-up: Return if symptoms worsen or fail to  improve.  Kieth JAYSON Carolin, MD 09/01/2024 11:02 AM

## 2024-09-05 LAB — CYTOLOGY - PAP
Adequacy: ABSENT
Diagnosis: NEGATIVE

## 2024-09-06 ENCOUNTER — Ambulatory Visit: Payer: Self-pay | Admitting: Obstetrics and Gynecology

## 2024-10-10 ENCOUNTER — Encounter: Payer: Self-pay | Admitting: Obstetrics and Gynecology

## 2024-10-19 ENCOUNTER — Other Ambulatory Visit (HOSPITAL_BASED_OUTPATIENT_CLINIC_OR_DEPARTMENT_OTHER): Payer: Self-pay

## 2024-10-19 DIAGNOSIS — F411 Generalized anxiety disorder: Secondary | ICD-10-CM | POA: Diagnosis not present

## 2024-10-19 MED ORDER — SERTRALINE HCL 50 MG PO TABS
50.0000 mg | ORAL_TABLET | Freq: Every day | ORAL | 0 refills | Status: AC
  Filled 2024-10-19 – 2024-11-06 (×2): qty 90, 90d supply, fill #0

## 2024-11-06 ENCOUNTER — Other Ambulatory Visit (HOSPITAL_BASED_OUTPATIENT_CLINIC_OR_DEPARTMENT_OTHER): Payer: Self-pay

## 2024-11-18 ENCOUNTER — Telehealth: Admitting: Nurse Practitioner

## 2024-11-18 DIAGNOSIS — J029 Acute pharyngitis, unspecified: Secondary | ICD-10-CM | POA: Diagnosis not present

## 2024-11-18 MED ORDER — LIDOCAINE VISCOUS HCL 2 % MT SOLN: 15.0000 mL | OROMUCOSAL | 0 refills | Status: AC | PRN

## 2024-11-18 NOTE — Progress Notes (Signed)
 We are sorry that you are not feeling well.  Here is how we plan to help!  Your symptoms indicate a likely viral infection (Pharyngitis).   Pharyngitis is inflammation in the back of the throat which can cause a sore throat, scratchiness and sometimes difficulty swallowing.   Pharyngitis is typically caused by a respiratory virus and will just run its course.  Please keep in mind that your symptoms could last up to 10 days.    For throat pain, we recommend over the counter oral pain relief medications such as acetaminophen or aspirin, or anti-inflammatory medications such as ibuprofen or naproxen  sodium.  Topical treatments such as oral throat lozenges or sprays may be used as needed. For throat pain, I have prescribed a Viscous Lidocaine  2% solution. Swallow 5-10 mL every 4-6 hours as needed for sore throat. DO NOT eat or drink anything for 15-20 minutes after swallowing to allow the medication to coat the throat.SABRA  Avoid close contact with loved ones, especially the very young and elderly.  Remember to wash your hands thoroughly throughout the day as this is the number one way to prevent the spread of infection! We also recommend that you periodically wipe down door knobs and counters with disinfectant.  After careful review of your answers, I would not recommend and antibiotic for your condition.  Antibiotics should not be used to treat conditions that we suspect are caused by viruses like the virus that causes the common cold or flu.  Providers prescribe antibiotics to treat infections caused by bacteria. Antibiotics are very powerful in treating bacterial infections when they are used properly.  To maintain their effectiveness, they should be used only when necessary.  Overuse of antibiotics has resulted in the development of super bugs that are resistant to treatment!    Some people with strep throat, however, can have atypical symptoms. As such, if anything continued to progress despite treatment  recommendations, you may need formal testing in clinic or office.  Home Care: Only take medications as instructed by your medical team. Do not drink alcohol while taking these medications. A steam or ultrasonic humidifier can help congestion.  You can place a towel over your head and breathe in the steam from hot water coming from a faucet. Avoid close contacts especially the very young and the elderly. Cover your mouth when you cough or sneeze. Always remember to wash your hands.  Get Help Right Away If: You develop worsening fever or throat pain. You develop a severe head ache or visual changes. Your symptoms persist after you have completed your treatment plan.  Make sure you Understand these instructions. Will watch your condition. Will get help right away if you are not doing well or get worse.  Your e-visit answers were reviewed by a board certified advanced clinical practitioner to complete your personal care plan.  Depending on the condition, your plan could have included both over the counter or prescription medications.  If there is a problem please reply once you have received a response from your provider.  Your safety is important to us .  If you have drug allergies check your prescription carefully.    You can use MyChart to ask questions about todays visit, request a non-urgent call back, or ask for a work or school excuse for 24 hours related to this e-Visit. If it has been greater than 24 hours you will need to follow up with your provider, or enter a new e-Visit to address those concerns.  You will get an e-mail in the next two days asking about your experience.  I hope that your e-visit has been valuable and will speed your recovery. Thank you for using e-visits.   I have spent 5 minutes in review of e-visit questionnaire, review and updating patient chart, medical decision making and response to patient.   Quintasha Gren W Jimmy Stipes, NP

## 2024-11-23 ENCOUNTER — Encounter: Admitting: Family Medicine

## 2024-11-28 ENCOUNTER — Encounter: Admitting: Family Medicine
